# Patient Record
Sex: Female | Born: 1955 | Race: White | Hispanic: No | Smoking: Never smoker
Health system: Southern US, Community
[De-identification: ages and names within clinical notes are randomized; demographics above are authoritative.]

## PROBLEM LIST (undated history)

## (undated) DIAGNOSIS — J45909 Unspecified asthma, uncomplicated: Secondary | ICD-10-CM

## (undated) DIAGNOSIS — N809 Endometriosis, unspecified: Secondary | ICD-10-CM

## (undated) DIAGNOSIS — H3320 Serous retinal detachment, unspecified eye: Secondary | ICD-10-CM

## (undated) HISTORY — PX: OOPHORECTOMY: SHX86

## (undated) HISTORY — PX: APPENDECTOMY: SHX54

## (undated) HISTORY — DX: Serous retinal detachment, unspecified eye: H33.20

## (undated) HISTORY — PX: BREAST CYST ASPIRATION: SHX578

## (undated) HISTORY — DX: Endometriosis, unspecified: N80.9

## (undated) HISTORY — PX: BREAST BIOPSY: SHX20

---

## 1984-09-01 DIAGNOSIS — H3320 Serous retinal detachment, unspecified eye: Secondary | ICD-10-CM

## 1984-09-01 HISTORY — DX: Serous retinal detachment, unspecified eye: H33.20

## 1988-09-01 DIAGNOSIS — N809 Endometriosis, unspecified: Secondary | ICD-10-CM

## 1988-09-01 HISTORY — DX: Endometriosis, unspecified: N80.9

## 1998-09-01 HISTORY — PX: ABDOMINAL HYSTERECTOMY: SHX81

## 2007-07-31 ENCOUNTER — Ambulatory Visit: Payer: Self-pay | Admitting: Emergency Medicine

## 2008-03-30 ENCOUNTER — Ambulatory Visit: Payer: Self-pay | Admitting: Nurse Practitioner

## 2008-04-06 ENCOUNTER — Ambulatory Visit: Payer: Self-pay | Admitting: Gastroenterology

## 2009-04-16 ENCOUNTER — Ambulatory Visit: Payer: Self-pay | Admitting: Obstetrics and Gynecology

## 2010-04-18 ENCOUNTER — Ambulatory Visit: Payer: Self-pay | Admitting: Obstetrics and Gynecology

## 2010-04-30 ENCOUNTER — Ambulatory Visit: Payer: Self-pay | Admitting: Obstetrics and Gynecology

## 2011-04-30 ENCOUNTER — Ambulatory Visit: Payer: Self-pay | Admitting: Obstetrics and Gynecology

## 2011-05-07 ENCOUNTER — Ambulatory Visit: Payer: Self-pay | Admitting: Obstetrics and Gynecology

## 2012-05-12 ENCOUNTER — Ambulatory Visit: Payer: Self-pay | Admitting: Obstetrics and Gynecology

## 2013-06-01 ENCOUNTER — Ambulatory Visit: Payer: Self-pay | Admitting: Obstetrics and Gynecology

## 2014-06-06 ENCOUNTER — Ambulatory Visit: Payer: Self-pay | Admitting: Obstetrics and Gynecology

## 2015-05-30 ENCOUNTER — Other Ambulatory Visit: Payer: Self-pay | Admitting: Obstetrics and Gynecology

## 2015-05-30 DIAGNOSIS — Z1231 Encounter for screening mammogram for malignant neoplasm of breast: Secondary | ICD-10-CM

## 2015-06-12 ENCOUNTER — Ambulatory Visit
Admission: RE | Admit: 2015-06-12 | Discharge: 2015-06-12 | Disposition: A | Discharge status: Home / Self Care | Payer: BC Managed Care – PPO | Source: Ambulatory Visit | Attending: Obstetrics and Gynecology | Admitting: Obstetrics and Gynecology

## 2015-06-12 DIAGNOSIS — Z1231 Encounter for screening mammogram for malignant neoplasm of breast: Secondary | ICD-10-CM | POA: Diagnosis present

## 2015-06-18 ENCOUNTER — Other Ambulatory Visit: Payer: Self-pay | Admitting: Obstetrics and Gynecology

## 2015-06-18 DIAGNOSIS — R928 Other abnormal and inconclusive findings on diagnostic imaging of breast: Secondary | ICD-10-CM

## 2015-06-29 ENCOUNTER — Ambulatory Visit
Admission: RE | Admit: 2015-06-29 | Discharge: 2015-06-29 | Disposition: A | Discharge status: Home / Self Care | Payer: BC Managed Care – PPO | Source: Ambulatory Visit | Attending: Obstetrics and Gynecology | Admitting: Obstetrics and Gynecology

## 2015-06-29 DIAGNOSIS — R928 Other abnormal and inconclusive findings on diagnostic imaging of breast: Secondary | ICD-10-CM | POA: Diagnosis present

## 2015-06-29 DIAGNOSIS — N63 Unspecified lump in breast: Secondary | ICD-10-CM | POA: Diagnosis not present

## 2015-12-09 ENCOUNTER — Encounter: Payer: Self-pay | Admitting: *Deleted

## 2015-12-09 ENCOUNTER — Observation Stay: Payer: BC Managed Care – PPO | Admitting: Anesthesiology

## 2015-12-09 ENCOUNTER — Emergency Department: Payer: BC Managed Care – PPO

## 2015-12-09 ENCOUNTER — Observation Stay
Admission: EM | Admit: 2015-12-09 | Discharge: 2015-12-10 | Disposition: A | Discharge status: Home / Self Care | Payer: BC Managed Care – PPO | Attending: Surgery | Admitting: Surgery

## 2015-12-09 ENCOUNTER — Encounter
Admission: EM | Disposition: A | Discharge status: Home / Self Care | Payer: Self-pay | Source: Home / Self Care | Attending: Emergency Medicine

## 2015-12-09 DIAGNOSIS — J45909 Unspecified asthma, uncomplicated: Secondary | ICD-10-CM | POA: Insufficient documentation

## 2015-12-09 DIAGNOSIS — K353 Acute appendicitis with localized peritonitis, without perforation or gangrene: Secondary | ICD-10-CM | POA: Diagnosis present

## 2015-12-09 DIAGNOSIS — E876 Hypokalemia: Secondary | ICD-10-CM

## 2015-12-09 DIAGNOSIS — K3580 Unspecified acute appendicitis: Secondary | ICD-10-CM

## 2015-12-09 DIAGNOSIS — Z79899 Other long term (current) drug therapy: Secondary | ICD-10-CM | POA: Insufficient documentation

## 2015-12-09 DIAGNOSIS — Z90710 Acquired absence of both cervix and uterus: Secondary | ICD-10-CM | POA: Insufficient documentation

## 2015-12-09 DIAGNOSIS — Z7951 Long term (current) use of inhaled steroids: Secondary | ICD-10-CM | POA: Diagnosis not present

## 2015-12-09 DIAGNOSIS — Z9889 Other specified postprocedural states: Secondary | ICD-10-CM | POA: Insufficient documentation

## 2015-12-09 DIAGNOSIS — M479 Spondylosis, unspecified: Secondary | ICD-10-CM | POA: Insufficient documentation

## 2015-12-09 DIAGNOSIS — M5136 Other intervertebral disc degeneration, lumbar region: Secondary | ICD-10-CM | POA: Insufficient documentation

## 2015-12-09 DIAGNOSIS — K8020 Calculus of gallbladder without cholecystitis without obstruction: Secondary | ICD-10-CM | POA: Diagnosis not present

## 2015-12-09 HISTORY — DX: Unspecified asthma, uncomplicated: J45.909

## 2015-12-09 HISTORY — PX: LAPAROSCOPIC APPENDECTOMY: SHX408

## 2015-12-09 LAB — CBC
HEMATOCRIT: 41.3 % (ref 35.0–47.0)
HEMOGLOBIN: 13.9 g/dL (ref 12.0–16.0)
MCH: 25.8 pg — ABNORMAL LOW (ref 26.0–34.0)
MCHC: 33.6 g/dL (ref 32.0–36.0)
MCV: 76.9 fL — AB (ref 80.0–100.0)
Platelets: 196 10*3/uL (ref 150–440)
RBC: 5.37 MIL/uL — AB (ref 3.80–5.20)
RDW: 13.5 % (ref 11.5–14.5)
WBC: 15.6 10*3/uL — AB (ref 3.6–11.0)

## 2015-12-09 LAB — COMPREHENSIVE METABOLIC PANEL
ALT: 18 U/L (ref 14–54)
ANION GAP: 8 (ref 5–15)
AST: 26 U/L (ref 15–41)
Albumin: 3.8 g/dL (ref 3.5–5.0)
Alkaline Phosphatase: 86 U/L (ref 38–126)
BUN: 10 mg/dL (ref 6–20)
CO2: 22 mmol/L (ref 22–32)
Calcium: 9.1 mg/dL (ref 8.9–10.3)
Chloride: 106 mmol/L (ref 101–111)
Creatinine, Ser: 0.61 mg/dL (ref 0.44–1.00)
GFR calc Af Amer: >60 mL/min (ref >60)
Glucose, Bld: 100 mg/dL — ABNORMAL HIGH (ref 65–99)
POTASSIUM: 3.1 mmol/L — AB (ref 3.5–5.1)
Sodium: 136 mmol/L (ref 135–145)
Total Bilirubin: 0.7 mg/dL (ref 0.3–1.2)
Total Protein: 7.3 g/dL (ref 6.5–8.1)

## 2015-12-09 LAB — URINALYSIS COMPLETE WITH MICROSCOPIC (ARMC ONLY)
BACTERIA UA: NONE SEEN
Bilirubin Urine: NEGATIVE
Glucose, UA: NEGATIVE mg/dL
HGB URINE DIPSTICK: NEGATIVE
LEUKOCYTES UA: NEGATIVE
NITRITE: NEGATIVE
PH: 9 — AB (ref 5.0–8.0)
PROTEIN: NEGATIVE mg/dL
SPECIFIC GRAVITY, URINE: 1.011 (ref 1.005–1.030)
SQUAMOUS EPITHELIAL / LPF: NONE SEEN

## 2015-12-09 LAB — LIPASE, BLOOD: LIPASE: 16 U/L (ref 11–51)

## 2015-12-09 SURGERY — APPENDECTOMY, LAPAROSCOPIC
Anesthesia: General | Site: Abdomen | Wound class: Clean Contaminated

## 2015-12-09 MED ORDER — METRONIDAZOLE IN NACL 5-0.79 MG/ML-% IV SOLN
500.0000 mg | Freq: Once | INTRAVENOUS | Status: DC
  Filled 2015-12-09: qty 100

## 2015-12-09 MED ORDER — VENLAFAXINE HCL ER 37.5 MG PO CP24
37.5000 mg | ORAL_CAPSULE | Freq: Every day | ORAL | Status: DC
  Filled 2015-12-09: qty 1

## 2015-12-09 MED ORDER — SODIUM CHLORIDE 0.9 % IV BOLUS (SEPSIS)
1000.0000 mL | Freq: Once | INTRAVENOUS | Status: AC
  Administered 2015-12-09: 1000 mL via INTRAVENOUS

## 2015-12-09 MED ORDER — PHENYLEPHRINE HCL 10 MG/ML IJ SOLN
INTRAMUSCULAR | Status: DC | PRN
  Administered 2015-12-09 (×2): 50 ug via INTRAVENOUS

## 2015-12-09 MED ORDER — BUPIVACAINE-EPINEPHRINE 0.25% -1:200000 IJ SOLN
INTRAMUSCULAR | Status: DC | PRN
  Administered 2015-12-09: 30 mL

## 2015-12-09 MED ORDER — ONDANSETRON HCL 4 MG/2ML IJ SOLN: 4.0000 mg | Freq: Once | INTRAMUSCULAR | Status: DC | PRN

## 2015-12-09 MED ORDER — ONDANSETRON HCL 4 MG/2ML IJ SOLN
4.0000 mg | Freq: Once | INTRAMUSCULAR | Status: AC
  Administered 2015-12-09: 4 mg via INTRAVENOUS
  Filled 2015-12-09: qty 2

## 2015-12-09 MED ORDER — MORPHINE SULFATE (PF) 2 MG/ML IV SOLN
2.0000 mg | INTRAVENOUS | Status: DC | PRN
Start: 2015-12-09 — End: 2015-12-10

## 2015-12-09 MED ORDER — KETOROLAC TROMETHAMINE 30 MG/ML IJ SOLN
30.0000 mg | Freq: Four times a day (QID) | INTRAMUSCULAR | Status: DC
  Administered 2015-12-09 – 2015-12-10 (×4): 30 mg via INTRAVENOUS
  Filled 2015-12-09 (×4): qty 1

## 2015-12-09 MED ORDER — SUCCINYLCHOLINE CHLORIDE 20 MG/ML IJ SOLN
INTRAMUSCULAR | Status: DC | PRN
  Administered 2015-12-09 (×2): 20 mg via INTRAVENOUS
  Administered 2015-12-09: 100 mg via INTRAVENOUS
  Administered 2015-12-09: 30 mg via INTRAVENOUS

## 2015-12-09 MED ORDER — CEFAZOLIN SODIUM 1-5 GM-% IV SOLN
INTRAVENOUS | Status: DC | PRN
  Administered 2015-12-09: 1 g via INTRAVENOUS

## 2015-12-09 MED ORDER — KETOROLAC TROMETHAMINE 30 MG/ML IJ SOLN
INTRAMUSCULAR | Status: DC | PRN
  Administered 2015-12-09: 30 mg via INTRAVENOUS

## 2015-12-09 MED ORDER — METRONIDAZOLE IN NACL 5-0.79 MG/ML-% IV SOLN
INTRAVENOUS | Status: DC | PRN
  Administered 2015-12-09: 500 mg via INTRAVENOUS

## 2015-12-09 MED ORDER — METRONIDAZOLE IN NACL 5-0.79 MG/ML-% IV SOLN
500.0000 mg | Freq: Once | INTRAVENOUS | Status: AC
Start: 2015-12-09 — End: 2015-12-09
  Administered 2015-12-09: 500 mg via INTRAVENOUS
  Filled 2015-12-09: qty 100

## 2015-12-09 MED ORDER — PROPOFOL 10 MG/ML IV BOLUS
INTRAVENOUS | Status: DC | PRN
  Administered 2015-12-09: 150 mg via INTRAVENOUS

## 2015-12-09 MED ORDER — DIATRIZOATE MEGLUMINE & SODIUM 66-10 % PO SOLN
15.0000 mL | Freq: Once | ORAL | Status: AC
  Administered 2015-12-09: 15 mL via ORAL
  Filled 2015-12-09: qty 30

## 2015-12-09 MED ORDER — CIPROFLOXACIN IN D5W 400 MG/200ML IV SOLN
400.0000 mg | Freq: Two times a day (BID) | INTRAVENOUS | Status: DC
  Administered 2015-12-10: 400 mg via INTRAVENOUS
  Filled 2015-12-09 (×2): qty 200

## 2015-12-09 MED ORDER — VENLAFAXINE HCL 37.5 MG PO TABS
150.0000 mg | ORAL_TABLET | Freq: Every day | ORAL | Status: DC
  Administered 2015-12-10: 150 mg via ORAL
  Filled 2015-12-09: qty 4

## 2015-12-09 MED ORDER — FENTANYL CITRATE (PF) 100 MCG/2ML IJ SOLN
INTRAMUSCULAR | Status: DC | PRN
  Administered 2015-12-09: 100 ug via INTRAVENOUS
  Administered 2015-12-09 (×2): 50 ug via INTRAVENOUS
  Administered 2015-12-09: 100 ug via INTRAVENOUS

## 2015-12-09 MED ORDER — METRONIDAZOLE IN NACL 5-0.79 MG/ML-% IV SOLN
500.0000 mg | Freq: Three times a day (TID) | INTRAVENOUS | Status: AC
  Administered 2015-12-10 (×2): 500 mg via INTRAVENOUS
  Filled 2015-12-09 (×2): qty 100

## 2015-12-09 MED ORDER — ONDANSETRON 8 MG PO TBDP: 4.0000 mg | ORAL_TABLET | Freq: Four times a day (QID) | ORAL | Status: DC | PRN

## 2015-12-09 MED ORDER — LACTATED RINGERS IV SOLN
INTRAVENOUS | Status: DC | PRN
  Administered 2015-12-09: 16:00:00 via INTRAVENOUS

## 2015-12-09 MED ORDER — CIPROFLOXACIN IN D5W 400 MG/200ML IV SOLN
400.0000 mg | Freq: Once | INTRAVENOUS | Status: DC
  Filled 2015-12-09: qty 200

## 2015-12-09 MED ORDER — ONDANSETRON HCL 4 MG/2ML IJ SOLN: 4.0000 mg | Freq: Four times a day (QID) | INTRAMUSCULAR | Status: DC | PRN

## 2015-12-09 MED ORDER — CIPROFLOXACIN IN D5W 400 MG/200ML IV SOLN
400.0000 mg | Freq: Once | INTRAVENOUS | Status: AC
Start: 2015-12-09 — End: 2015-12-09
  Administered 2015-12-09: 400 mg via INTRAVENOUS
  Filled 2015-12-09: qty 200

## 2015-12-09 MED ORDER — MIDAZOLAM HCL 2 MG/2ML IJ SOLN
INTRAMUSCULAR | Status: DC | PRN
  Administered 2015-12-09 (×2): 2 mg via INTRAVENOUS

## 2015-12-09 MED ORDER — POTASSIUM CHLORIDE 10 MEQ/100ML IV SOLN
10.0000 meq | INTRAVENOUS | Status: AC
  Administered 2015-12-09 (×2): 10 meq via INTRAVENOUS
  Filled 2015-12-09 (×2): qty 100

## 2015-12-09 MED ORDER — IOPAMIDOL (ISOVUE-370) INJECTION 76%
100.0000 mL | Freq: Once | INTRAVENOUS | Status: AC | PRN
  Administered 2015-12-09: 100 mL via INTRAVENOUS

## 2015-12-09 MED ORDER — OXYCODONE HCL 5 MG PO TABS: 5.0000 mg | ORAL_TABLET | ORAL | Status: DC | PRN

## 2015-12-09 MED ORDER — ONDANSETRON HCL 4 MG/2ML IJ SOLN
INTRAMUSCULAR | Status: DC | PRN
  Administered 2015-12-09: 4 mg via INTRAVENOUS

## 2015-12-09 MED ORDER — FENTANYL CITRATE (PF) 100 MCG/2ML IJ SOLN: 25.0000 ug | INTRAMUSCULAR | Status: DC | PRN

## 2015-12-09 MED ORDER — PANTOPRAZOLE SODIUM 40 MG IV SOLR
40.0000 mg | Freq: Every day | INTRAVENOUS | Status: DC
  Administered 2015-12-09: 40 mg via INTRAVENOUS
  Filled 2015-12-09: qty 40

## 2015-12-09 MED ORDER — CIPROFLOXACIN IN D5W 400 MG/200ML IV SOLN
INTRAVENOUS | Status: DC | PRN
Start: 2015-12-09 — End: 2015-12-09
  Administered 2015-12-09: 400 mg via INTRAVENOUS

## 2015-12-09 MED ORDER — HYDROMORPHONE HCL 1 MG/ML IJ SOLN
0.5000 mg | Freq: Once | INTRAMUSCULAR | Status: DC
  Filled 2015-12-09: qty 1

## 2015-12-09 MED ORDER — LACTATED RINGERS IV SOLN
INTRAVENOUS | Status: DC
  Administered 2015-12-09 – 2015-12-10 (×2): via INTRAVENOUS

## 2015-12-09 MED ORDER — ENOXAPARIN SODIUM 40 MG/0.4ML ~~LOC~~ SOLN
40.0000 mg | SUBCUTANEOUS | Status: DC
  Administered 2015-12-09: 40 mg via SUBCUTANEOUS
  Filled 2015-12-09: qty 0.4

## 2015-12-09 SURGICAL SUPPLY — 35 items
APPLIER CLIP 5 13 M/L LIGAMAX5 (MISCELLANEOUS) ×3
BLADE CLIPPER SURG (BLADE) ×3 IMPLANT
BLADE SURG SZ11 CARB STEEL (BLADE) ×3 IMPLANT
BULB RESERV EVAC DRAIN JP 100C (MISCELLANEOUS) ×3 IMPLANT
CANISTER SUCT 3000ML (MISCELLANEOUS) ×3 IMPLANT
CHLORAPREP W/TINT 26ML (MISCELLANEOUS) ×3 IMPLANT
CLIP APPLIE 5 13 M/L LIGAMAX5 (MISCELLANEOUS) ×1 IMPLANT
CUTTER FLEX LINEAR 45M (STAPLE) ×3 IMPLANT
DRAIN CHANNEL JP 19F (MISCELLANEOUS) ×3 IMPLANT
ELECT REM PT RETURN 9FT ADLT (ELECTROSURGICAL) ×3
ELECTRODE REM PT RTRN 9FT ADLT (ELECTROSURGICAL) ×1 IMPLANT
ENDOPOUCH RETRIEVER 10 (MISCELLANEOUS) ×3 IMPLANT
GLOVE BIO SURGEON STRL SZ7 (GLOVE) ×3 IMPLANT
GOWN STRL REUS W/ TWL LRG LVL3 (GOWN DISPOSABLE) ×1 IMPLANT
GOWN STRL REUS W/TWL LRG LVL3 (GOWN DISPOSABLE) ×2
IRRIGATION STRYKERFLOW (MISCELLANEOUS) ×1 IMPLANT
IRRIGATOR STRYKERFLOW (MISCELLANEOUS) ×3
LIQUID BAND (GAUZE/BANDAGES/DRESSINGS) ×3 IMPLANT
MARKER SKIN DUAL TIP RULER LAB (MISCELLANEOUS) ×3 IMPLANT
NEEDLE HYPO 25X1 1.5 SAFETY (NEEDLE) ×3 IMPLANT
PACK LAP CHOLECYSTECTOMY (MISCELLANEOUS) ×3 IMPLANT
PENCIL ELECTRO HAND CTR (MISCELLANEOUS) ×3 IMPLANT
RELOAD 45 VASCULAR/THIN (ENDOMECHANICALS) ×3 IMPLANT
RELOAD STAPLE TA45 3.5 REG BLU (ENDOMECHANICALS) IMPLANT
SCALPEL HARMONIC ACE (MISCELLANEOUS) ×3 IMPLANT
SCISSORS METZENBAUM CVD 33 (INSTRUMENTS) ×3 IMPLANT
SUT MNCRL AB 4-0 PS2 18 (SUTURE) ×3 IMPLANT
SUT VICRYL 0 AB UR-6 (SUTURE) ×6 IMPLANT
SYR 20CC LL (SYRINGE) ×3 IMPLANT
TRAY FOLEY W/METER SILVER 16FR (SET/KITS/TRAYS/PACK) IMPLANT
TROCAR BALLN 12MMX100 BLUNT (TROCAR) ×3 IMPLANT
TROCAR Z-THREAD OPTICAL 5X100M (TROCAR) ×6 IMPLANT
TUBING CONNECTING 10 (TUBING) ×2 IMPLANT
TUBING CONNECTING 10' (TUBING) ×1
WATER STERILE IRR 1000ML POUR (IV SOLUTION) IMPLANT

## 2015-12-09 NOTE — ED Notes (Signed)
States RLQ abd pain that began at 0200 this AM radiating to her belly button, woke her up from sleep, states she has been sick with flu like symptoms since Thursday, nausea and vomiting on Friday and slept a lot, states some chest tightness when she coughs, pt awake and alert, husband at bedside

## 2015-12-09 NOTE — Anesthesia Procedure Notes (Signed)
Procedure Name: Intubation Date/Time: 12/09/2015 4:10 PM Performed by: Lorie Apley Pre-anesthesia Checklist: Patient identified, Emergency Drugs available, Suction available, Patient being monitored and Timeout performed Patient Re-evaluated:Patient Re-evaluated prior to inductionOxygen Delivery Method: Circle system utilized Preoxygenation: Pre-oxygenation with 100% oxygen Intubation Type: IV induction Ventilation: Mask ventilation without difficulty Laryngoscope Size: Mac and 3 Grade View: Grade I Tube type: Oral Tube size: 7.5 mm Number of attempts: 1 Placement Confirmation: ETT inserted through vocal cords under direct vision Secured at: 22 cm Tube secured with: Tape

## 2015-12-09 NOTE — Progress Notes (Signed)
Contacted on call provider, Dr. Adonis Huguenin. Pt was ordered 37.5 mg of effexor. Pt takes 150mg  at home. Order modified.

## 2015-12-09 NOTE — Anesthesia Preprocedure Evaluation (Addendum)
Anesthesia Evaluation  Patient identified by MRN, date of birth, ID band Patient awake    Reviewed: Allergy & Precautions, NPO status , Patient's Chart, lab work & pertinent test results  Airway Mallampati: II  TM Distance: >3 FB     Dental no notable dental hx.    Pulmonary asthma ,  Sinus allergies   Pulmonary exam normal        Cardiovascular negative cardio ROS Normal cardiovascular exam     Neuro/Psych negative neurological ROS  negative psych ROS   GI/Hepatic Neg liver ROS, Acute appendix   Endo/Other  negative endocrine ROS  Renal/GU negative Renal ROS  negative genitourinary   Musculoskeletal negative musculoskeletal ROS (+)   Abdominal Normal abdominal exam  (+)   Peds negative pediatric ROS (+)  Hematology negative hematology ROS (+)   Anesthesia Other Findings   Reproductive/Obstetrics negative OB ROS                            Anesthesia Physical Anesthesia Plan  ASA: II and emergent  Anesthesia Plan: General   Post-op Pain Management:    Induction: Intravenous, Rapid sequence and Cricoid pressure planned  Airway Management Planned: Oral ETT  Additional Equipment:   Intra-op Plan:   Post-operative Plan: Extubation in OR  Informed Consent: I have reviewed the patients History and Physical, chart, labs and discussed the procedure including the risks, benefits and alternatives for the proposed anesthesia with the patient or authorized representative who has indicated his/her understanding and acceptance.   Dental advisory given  Plan Discussed with: CRNA and Surgeon  Anesthesia Plan Comments:         Anesthesia Quick Evaluation

## 2015-12-09 NOTE — ED Notes (Signed)
MD at bedside. 

## 2015-12-09 NOTE — ED Notes (Signed)
Pt transported to OR by OR RN. All belongings including clothes and fitbit placed in belongings bag and given to pt's spouse.

## 2015-12-09 NOTE — ED Provider Notes (Signed)
[facility] Emergency Department Provider Note  ____________________________________________  Time seen: Approximately 9:05 AM  I have reviewed the triage vital signs and the nursing notes.   HISTORY  Chief Complaint Abdominal Pain    HPI Chelsea Conner is a 60 y.o. female S/P hysterectomy and left oophorectomy presenting with right lower quadrant pain.Patient reports that 3 nights ago she fell "acidy" prior to going to bed, then woke up in the middle of night with a single episode of vomiting. The following day she had fever to 102. She had general malaise and some loose stools that day. Yesterday, she was feeling much better and was able to go about her normal ADLs without any difficulties or symptoms. This morning, she developed nausea without vomiting, had a loose stool, and right lower quadrant pain. She describes a "gas-like" sensation that feels like it moves around and is better or worse at different times, but is not particularly alleviated after BM. No urinary symptoms, fever in the last 24 hours, or shaking chills.   Past Medical History  Diagnosis Date  . Asthma     There are no active problems to display for this patient.   Past Surgical History  Procedure Laterality Date  . Abdominal hysterectomy    . Breast cyst aspiration Left     neg  . Breast biopsy Bilateral 98 &2000    RT /CLIP, LT- NO CLIP- BOTH NEG    Current Outpatient Rx  Name  Route  Sig  Dispense  Refill  . acetaminophen (TYLENOL) 500 MG tablet   Oral   Take 1,000 mg by mouth every 6 (six) hours as needed.         . calcium carbonate (CALCIUM 600) 600 MG TABS tablet   Oral   Take 1 tablet by mouth daily.         . Loratadine 10 MG CAPS   Oral   Take 10 mg by mouth daily.         . pseudoephedrine (SUDAFED) 120 MG 12 hr tablet   Oral   Take 120 mg by mouth daily as needed.         . triamcinolone (NASACORT) 55 MCG/ACT AERO nasal inhaler   Nasal  Place 2 sprays into the nose daily.         Marland Kitchen venlafaxine XR (EFFEXOR-XR) 37.5 MG 24 hr capsule   Oral   Take 4 capsules by mouth daily.      11     Allergies Neomycin; Penicillins; and Codeine  History reviewed. No pertinent family history.  Social History Social History  Substance Use Topics  . Smoking status: None  . Smokeless tobacco: None  . Alcohol Use: None    Review of Systems Constitutional: Positive fever 3 days ago. No lightheadedness or syncope. Eyes: No visual changes. ENT: No sore throat. No congestion or rhinorrhea. Cardiovascular: Denies chest pain. Denies palpitations. Respiratory: Denies shortness of breath.  No cough. Gastrointestinal: Positive right lower quadrant abdominal pain.  Positive nausea, positive vomiting.  Positive diarrhea.  No constipation. Genitourinary: Negative for dysuria. Musculoskeletal: Negative for back pain. Skin: Negative for rash. Neurological: Negative for headaches. No focal numbness, tingling or weakness.   10-point ROS otherwise negative.  ____________________________________________   PHYSICAL EXAM:  VITAL SIGNS: ED Triage Vitals  Enc Vitals Group     BP 12/09/15 0816 143/61 mmHg     Pulse Rate 12/09/15 0816 78     Resp 12/09/15 0816 18  Temp 12/09/15 0816 98.9 F (37.2 C)     Temp Source 12/09/15 0816 Oral     SpO2 12/09/15 0816 100 %     Weight 12/09/15 0816 163 lb (73.936 kg)     Height 12/09/15 0816 5\' 2"  (1.575 m)     Head Cir --      Peak Flow --      Pain Score 12/09/15 0817 1     Pain Loc --      Pain Edu? --      Excl. in [place]? --     Constitutional: Alert and oriented. Well appearing and in no acute distress. Answers questions appropriately. Eyes: Conjunctivae are normal.  EOMI. No scleral icterus. Head: Atraumatic. Nose: No congestion/rhinnorhea. Mouth/Throat: Mucous membranes are moist.  Neck: No stridor.  Supple.   Cardiovascular: Normal rate, regular rhythm. No murmurs, rubs or  gallops.  Respiratory: Normal respiratory effort.  No accessory muscle use or retractions. Lungs CTAB.  No wheezes, rales or ronchi. Gastrointestinal: Abdomen is soft and nondistended. The patient has some right lower greater than right upper quadrant abdominal pain.  No guarding or rebound.  No peritoneal signs. Musculoskeletal: No LE edema. No ttp in the calves or palpable cords.  Negative Homan's sign. Neurologic:  A&Ox3.  Speech is clear.  Face and smile are symmetric.  EOMI.  Moves all extremities well. Skin:  Skin is warm, dry and intact. No rash noted. Psychiatric: Mood and affect are normal. Speech and behavior are normal.  Normal judgement.  ____________________________________________   LABS (all labs ordered are listed, but only abnormal results are displayed)  Labs Reviewed  COMPREHENSIVE METABOLIC PANEL - Abnormal; Notable for the following:    Potassium 3.1 (*)    Glucose, Bld 100 (*)    All other components within normal limits  CBC - Abnormal; Notable for the following:    WBC 15.6 (*)    RBC 5.37 (*)    MCV 76.9 (*)    MCH 25.8 (*)    All other components within normal limits  URINALYSIS COMPLETEWITH MICROSCOPIC ([facility] ONLY) - Abnormal; Notable for the following:    Color, Urine YELLOW (*)    APPearance CLOUDY (*)    Ketones, ur TRACE (*)    pH 9.0 (*)    All other components within normal limits  LIPASE, BLOOD   ____________________________________________  EKG  ED ECG REPORT I, Eula Listen, the attending physician, personally viewed and interpreted this ECG.   Date: 12/09/2015  EKG Time: 818  Rate: 82  Rhythm: normal sinus rhythm  Axis: Normal  Intervals:none  ST&T Change: Nonspecific T-wave inversion in V1.  ____________________________________________  RADIOLOGY  Ct Abdomen Pelvis W Contrast  12/09/2015  CLINICAL DATA:  Right lower quadrant abdominal pain EXAM: CT ABDOMEN AND PELVIS WITH CONTRAST TECHNIQUE: Multidetector CT imaging of  the abdomen and pelvis was performed using the standard protocol following bolus administration of intravenous contrast. CONTRAST:  100 cc of Isovue 370 COMPARISON:  None FINDINGS: Lower chest:  No acute findings. Hepatobiliary: No masses or other significant abnormality. Right lobe of liver cyst measures 1.4 cm. There are multiple large stones within the gallbladder which measure up to 1.9 cm. No biliary dilatation. Pancreas: No mass, inflammatory changes, or other significant abnormality. Spleen: Within normal limits in size and appearance. Adrenals/Urinary Tract: No masses identified. No evidence of hydronephrosis. Stomach/Bowel: The stomach is within normal limits. The small bowel loops have a normal course and caliber. No obstruction. Normal appearance of  the colon. The appendix is thickened measuring 11 mm. There is periappendiceal inflammation. A small amount of fluid extends into the pelvis. No evidence for abscess. Vascular/Lymphatic: No pathologically enlarged lymph nodes. No evidence of abdominal aortic aneurysm. Reproductive: No mass or other significant abnormality. Other: None. Musculoskeletal: No suspicious bone lesions identified. 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 There is an anterolisthesis of L4 on L5. IMPRESSION: 1. Examination is positive for acute appendicitis. 2. No abscess. 3. Lumbar spondylosis noted. 4. Gallstones. Electronically Signed   By: Kerby Moors M.D.   On: 12/09/2015 10:32    ____________________________________________   PROCEDURES  Procedure(s) performed: None  Critical Care performed: No ____________________________________________   INITIAL IMPRESSION / ASSESSMENT AND PLAN / ED COURSE  Pertinent labs & imaging results that were available during my care of the patient were reviewed by me and considered in my medical decision making (see chart for details).  60 y.o. female presenting w/ vomiting x 1, fever now resolved, loose stool  and now RLQ pain.  I will evaluate the pt for appendicitis, consider viral GI illness.  Ovarian pathology is possible but less likely as her palpable pain is in the upper RUQ rather than low in the pelvis.  Plan to initiate symptomatic treatment and re-eval the patient for final disposition.  ----------------------------------------- 10:54 AM on 12/09/2015 -----------------------------------------  The patient has acute appendicitis and I will initiate IV antibiotics with Cipro and Flagyl as she has a penicillin allergy. I have talked to the general surgeon who is in a case in the operating room but will see the patient in the next 20-30 minutes.  ____________________________________________  FINAL CLINICAL IMPRESSION(S) / ED DIAGNOSES  Final diagnoses:  Acute appendicitis, unspecified acute appendicitis type  Hypokalemia      NEW MEDICATIONS STARTED DURING THIS VISIT:  New Prescriptions   No medications on file     Eula Listen, MD 12/09/15 1054

## 2015-12-09 NOTE — ED Notes (Addendum)
Pt states she has similar pain several years ago when she had her left ovary removed, right ovary still in place

## 2015-12-09 NOTE — H&P (Signed)
Patient ID: Chelsea Conner, female   DOB: 1956-08-04, 60 y.o.   MRN: [id number]  History of Present Illness Chelsea Conner is a 60 y.o. female with abd pain. She actually started with nausea and vomiting and fevers up to 102. This morning she started having right lower quadrant pain, pain is moderate, intermittent and not radiating. It is sharp in nature and worsening with movement. She does have associated nausea and hypoRexia She has had at abdominal hysterectomy in the past and no other abdominal operations. she does have good cardiovascular reserve Past Medical History Past Medical History  Diagnosis Date  . Asthma     Past Surgical History  Procedure Laterality Date  . Abdominal hysterectomy    . Breast cyst aspiration Left     neg  . Breast biopsy Bilateral 98 &2000    RT /CLIP, LT- NO CLIP- BOTH NEG   ALL: per chart   Current Facility-Administered Medications  Medication Dose Route Frequency Provider Last Rate Last Dose  . HYDROmorphone (DILAUDID) injection 0.5 mg  0.5 mg Intravenous Once Eula Listen, MD   0 mg at 12/09/15 0916  . potassium chloride 10 mEq in 100 mL IVPB  10 mEq Intravenous Q1 Hr x 2 Anne-Caroline Norman, MD 100 mL/hr at 12/09/15 1333 10 mEq at 12/09/15 1333   Current Outpatient Prescriptions  Medication Sig Dispense Refill  . acetaminophen (TYLENOL) 500 MG tablet Take 1,000 mg by mouth every 6 (six) hours as needed.    . calcium carbonate (CALCIUM 600) 600 MG TABS tablet Take 1 tablet by mouth daily.    . Loratadine 10 MG CAPS Take 10 mg by mouth daily.    . pseudoephedrine (SUDAFED) 120 MG 12 hr tablet Take 120 mg by mouth daily as needed.    . triamcinolone (NASACORT) 55 MCG/ACT AERO nasal inhaler Place 2 sprays into the nose daily.    Marland Kitchen venlafaxine XR (EFFEXOR-XR) 37.5 MG 24 hr capsule Take 4 capsules by mouth daily.  11    Family History History reviewed. No pertinent family history.    Social History Social History   Substance Use Topics  . Smoking status: None  . Smokeless tobacco: None  . Alcohol Use: None       ROS 10 ptl review of system was performed and is otherwise negative  Physical Exam Blood pressure 107/52, pulse 76, temperature 98.9 F (37.2 C), temperature source Oral, resp. rate 15, height 5\' 2"  (1.575 m), weight 73.936 kg (163 lb), SpO2 100 %.  CONSTITUTIONAL: NAD alert EYES: Pupils equal, round, and reactive to light, Sclera non-icteric. EARS, NOSE, MOUTH AND THROAT: The oropharynx is clear. Oral mucosa is pink and moist. Hearing is intact to voice.  NECK: Trachea is midline, and there is no jugular venous distension. Thyroid is without palpable abnormalities. LYMPH NODES:  Lymph nodes in the neck are not enlarged. RESPIRATORY:  Lungs are clear, and breath sounds are equal bilaterally. Normal respiratory effort without pathologic use of accessory muscles. CARDIOVASCULAR: Heart is regular without murmurs, gallops, or rubs. GI: The abdomen is soft, tender RLQ, no peritonitis and nondistended. There were no palpable masses. There was no hepatosplenomegaly. There were normal bowel sounds. MUSCULOSKELETAL:  Normal muscle strength and tone in all four extremities.    SKIN: Skin turgor is normal. There are no pathologic skin lesions.  NEUROLOGIC:  Motor and sensation is grossly normal.  Cranial nerves are grossly intact. PSYCH:  Alert and oriented to person, place and time. Affect is normal.  Data Reviewed The scan review consistent with appendicitis no evidence of abscess or perforation  I have personally reviewed the patient's imaging and medical records.    Assessment/ Plan  Acute appendicitis without evidence of perforation in need for laparoscopic appendectomy. She is been febrile with increasing white count . Procedure discussed with the patient in detail. Risks benefits and possible complications including but not limited to: Bleeding, infection, injury to adjacent structures  and re-interventions she understands and wishes to proceed. Extensive counseling provided we'll go ahead and do it is evening.  Laparoscopic possible open  Caroleen Hamman, MD [place] 12/09/2015, 2:12 PM

## 2015-12-09 NOTE — ED Notes (Signed)
Pt up to provide urine sample

## 2015-12-09 NOTE — ED Notes (Signed)
OR staff at bedside.

## 2015-12-09 NOTE — ED Notes (Signed)
Pt finished with oral contrast, CT informed.

## 2015-12-09 NOTE — ED Notes (Signed)
Pt up to bathroom. In to administer IV antibiotics, unable to do so at this time.

## 2015-12-09 NOTE — Transfer of Care (Signed)
Immediate Anesthesia Transfer of Care Note  Patient: Chelsea Conner  Procedure(s) Performed: Procedure(s): APPENDECTOMY LAPAROSCOPIC (N/A)  Patient Location: PACU  Anesthesia Type:General  Level of Consciousness: awake, alert  and oriented  Airway & Oxygen Therapy: Patient Spontanous Breathing  Post-op Assessment: Report given to RN and Post -op Vital signs reviewed and stable  Post vital signs: Reviewed and stable  Last Vitals:  Filed Vitals:   12/09/15 1528 12/09/15 1734  BP:  117/70  Pulse:  101  Temp: 37.9 C   Resp:  15    Complications: No apparent anesthesia complications

## 2015-12-09 NOTE — Op Note (Signed)
laparascopic appendectomy   Chelsea Conner Date of operation:  12/09/2015  Indications: The patient presented with a history of  abdominal pain. Workup has revealed findings consistent with acute appendicitis.  Pre-operative Diagnosis: Acute appendicitis without mention of peritonitis  Post-operative Diagnosis: Acute appendicitis without mention of peritonitis  PROCEDURES: 1. Laparoscopic enterolysis 2. Lap  Appendectomy  Surgeon: Caroleen Hamman, MD, FACS  Anesthesia: General with endotracheal tube  Findings: Acute non perforated appendicitis, retrocecal.                  Thick adhesions from the abdominal wall to the cecum  Estimated Blood Loss: 10 cc         Specimens: appendix         Complications:  none  Procedure Details  The patient was seen again in the preop area. The options of surgery versus observation were reviewed with the patient and/or family. The risks of bleeding, infection, recurrence of symptoms, negative laparoscopy, potential for an open procedure, bowel injury, abscess or infection, were all reviewed as well. The patient was taken to Operating Room, identified as Chelsea Conner and the procedure verified as laparoscopic appendectomy. A Time Out was held and the above information confirmed.  The patient was placed in the supine position and general anesthesia was induced.  Antibiotic prophylaxis was administered and VT E prophylaxis was in place. A Foley catheter was placed by the nursing staff.   The abdomen was prepped and draped in a sterile fashion. An infraumbilical incision was made. A cutdown technique was used to enter the abdominal cavity. Two vicryl stitches were placed on the fascia and a Hasson trocar inserted. Pneumoperitoneum obtained. Two 5 mm ports were placed under direct visualization. Extensive and thick adhesions encountered from the cecum to the abdominal wall this were taken very careful with a combination of scissors and harmonic  scalpel. Once we have a good window of dissection, The appendix was identified and found to be acutely inflamed  The appendix was carefully dissected. The base of the appendix was dissected out and divided with a standard load Endo GIA. The mesoappendix was divided withHarmonic scalpel. The appendix was passed out through the left lateral port site with the aid of an Endo Catch bag. The right lower quadrant and pelvis was then irrigated with  normal saline which was aspirated. Inspection  failed to identify any additional bleeding and there were no signs of bowel injury.    Again the right lower quadrant was inspected there was no sign of bleeding or bowel injury therefore pneumoperitoneum was released, Umbilical fascia closed with interrupted 0 vicrils. all ports were removed and the skin incisions were approximated with subcuticular 4-0 Monocryl. Dermabond was  placed.  The patient tolerated the procedure well, there were no complications. The sponge lap and needle count were correct at the end of the procedure.  The patient was taken to the recovery room in stable condition to be admitted for continued care.    Caroleen Hamman, MD FACS

## 2015-12-09 NOTE — Anesthesia Postprocedure Evaluation (Signed)
Anesthesia Post Note  Patient: Chelsea Conner  Procedure(s) Performed: Procedure(s) (LRB): APPENDECTOMY LAPAROSCOPIC (N/A)  Patient location during evaluation: PACU Anesthesia Type: General Level of consciousness: awake and alert and oriented Pain management: pain level controlled Vital Signs Assessment: post-procedure vital signs reviewed and stable Respiratory status: spontaneous breathing Cardiovascular status: blood pressure returned to baseline Anesthetic complications: no    Last Vitals:  Filed Vitals:   12/09/15 1815 12/09/15 1841  BP: 113/64 116/65  Pulse: 82 74  Temp:  36.9 C  Resp: 21     Last Pain:  Filed Vitals:   12/09/15 1843  PainSc: 0-No pain                 Rishab Stoudt

## 2015-12-09 NOTE — ED Notes (Signed)
Report called to [place]

## 2015-12-10 ENCOUNTER — Encounter: Payer: Self-pay | Admitting: *Deleted

## 2015-12-10 MED ORDER — METRONIDAZOLE 500 MG PO TABS: 500.0000 mg | ORAL_TABLET | Freq: Three times a day (TID) | ORAL | Status: DC

## 2015-12-10 MED ORDER — OXYCODONE-ACETAMINOPHEN 5-325 MG PO TABS: 1.0000 | ORAL_TABLET | ORAL | Status: AC | PRN

## 2015-12-10 MED ORDER — CIPROFLOXACIN HCL 500 MG PO TABS: 500.0000 mg | ORAL_TABLET | Freq: Two times a day (BID) | ORAL | Status: DC

## 2015-12-10 NOTE — Discharge Summary (Signed)
Physician Discharge Summary  Patient ID: Chelsea Conner MRN: [id number] DOB/AGE: 1955-09-08 60 y.o.  Admit date: 12/09/2015 Discharge date: 12/10/2015  Admission Diagnoses:Acute appendicitis  Discharge Diagnoses:  Active Problems:   Acute appendicitis with localized peritonitis   Discharged Condition: good  Hospital Course: 60 yr old with acute appendicitis.  Underwent lap appy on 4/9 with Dr. Dahlia Byes, with some murky fluid in pelvis, JP drain left.  Patient doing well, states eating well, passing flatus.  She has been up and walking around. She has not needed any pain medications, states pain well controlled now.   Consults: None  Significant Diagnostic Studies: CT scan  Treatments: IV hydration and antibiotics: Cipro and Meropenem  Discharge Exam: Blood pressure 104/55, pulse 64, temperature 98.5 F (36.9 C), temperature source Oral, resp. rate 17, height 5\' 2"  (1.575 m), weight 163 lb (73.936 kg), SpO2 95 %. General appearance: alert, cooperative and no distress GI: soft, mild distension, appropriately tender, incisions c/d/i, jp drain in lower incision with serosanginous output Extremities: extremities normal, atraumatic, no cyanosis or edema  Disposition: Final discharge disposition not confirmed  Discharge Instructions    Call MD for:  persistant nausea and vomiting    Complete by:  As directed      Call MD for:  redness, tenderness, or signs of infection (pain, swelling, redness, odor or green/yellow discharge around incision site)    Complete by:  As directed      Call MD for:  severe uncontrolled pain    Complete by:  As directed      Call MD for:  temperature >100.4    Complete by:  As directed      Change dressing (specify)    Complete by:  As directed   Dressing change: 1 times per day using dry dressing around tube if needed, if no leaking do not need dressing.     Diet general    Complete by:  As directed      Discharge instructions    Complete by:  As  directed   Please empty, measure and recharge JP drain daily and as needed     Driving Restrictions    Complete by:  As directed   No driving while on prescription pain medication     Increase activity slowly    Complete by:  As directed      Lifting restrictions    Complete by:  As directed   No lifting over 15lb for 4weeks     May shower / Bathe    Complete by:  As directed      May walk up steps    Complete by:  As directed             Medication List    TAKE these medications        acetaminophen 500 MG tablet  Commonly known as:  TYLENOL  Take 1,000 mg by mouth every 6 (six) hours as needed.     CALCIUM 600 600 MG Tabs tablet  Generic drug:  calcium carbonate  Take 1 tablet by mouth daily.     ciprofloxacin 500 MG tablet  Commonly known as:  CIPRO  Take 1 tablet (500 mg total) by mouth 2 (two) times daily.     Loratadine 10 MG Caps  Take 10 mg by mouth daily.     metroNIDAZOLE 500 MG tablet  Commonly known as:  FLAGYL  Take 1 tablet (500 mg total) by mouth 3 (three) times daily.  oxyCODONE-acetaminophen 5-325 MG tablet  Commonly known as:  ROXICET  Take 1-2 tablets by mouth every 4 (four) hours as needed for moderate pain or severe pain.     pseudoephedrine 120 MG 12 hr tablet  Commonly known as:  SUDAFED  Take 120 mg by mouth daily as needed.     triamcinolone 55 MCG/ACT Aero nasal inhaler  Commonly known as:  NASACORT  Place 2 sprays into the nose daily.     venlafaxine XR 37.5 MG 24 hr capsule  Commonly known as:  EFFEXOR-XR  Take 4 capsules by mouth daily.           Follow-up Information    Follow up with Jules Husbands, MD.   Specialty:  General Surgery   Contact information:   [address] [place]  [address] [phone]       Signed: Hubbard Robinson 12/10/2015, 3:06 PM

## 2015-12-11 ENCOUNTER — Telehealth: Payer: Self-pay

## 2015-12-11 ENCOUNTER — Telehealth: Payer: Self-pay | Admitting: Surgery

## 2015-12-11 LAB — SURGICAL PATHOLOGY

## 2015-12-11 NOTE — Telephone Encounter (Signed)
Patient called in and states that she is having some leakage at the insertion site of the JP drain. I had an in depth conversation of how to empty the drain and make sure that it is recharged and that it is holding suction. I was having a great amount of difficulty understanding and explaining this to the patient. The patient placed me on speaker phone so that her husband could hear what I was saying as well. I attempted once again to explain how to empty drain, recharging, and trouble shooting of drain. After this conversation, I am understanding that the drain has been emptied for a total of 82ml over the last 18 hours. 10cc at 11pm last night, 0400- 20cc, 0800- 20cc, and 1130am- 10cc. So drain seems to be working correctly. The drainage sounds to be serosangionous at this time. The patient has not been keeping a dressing over the insertion site of the drain and this has been soaking her clothes. I explained that she would need to get some 4x4 guaze, cut half way through one side of guaze, and place around the drain like a C- shape. She seem to understand what I meant. I explained that she needs to change this dressing at least once a day and when it becomes soiled. The conversation was very difficult with this patient as I do not know if I was getting through to the patient but husband read back instructions. Patient denies severe pain, nausea, vomiting, fever, drainage of purulent fluid, or any other symptom at this time. Asked for patient to call back if after placing a guaze on this area, she is needing to change this guaze once an hour or more frequently. She verbalizes understanding of this part of the conversation.

## 2015-12-11 NOTE — Telephone Encounter (Signed)
error 

## 2015-12-13 ENCOUNTER — Encounter: Payer: Self-pay | Admitting: Surgery

## 2015-12-13 ENCOUNTER — Ambulatory Visit (INDEPENDENT_AMBULATORY_CARE_PROVIDER_SITE_OTHER): Payer: BC Managed Care – PPO | Admitting: Surgery

## 2015-12-13 ENCOUNTER — Other Ambulatory Visit: Payer: Self-pay

## 2015-12-13 VITALS — BP 143/81 | HR 84 | Temp 98.6°F | Ht 62.0 in | Wt 168.4 lb

## 2015-12-13 DIAGNOSIS — Z09 Encounter for follow-up examination after completed treatment for conditions other than malignant neoplasm: Secondary | ICD-10-CM

## 2015-12-13 DIAGNOSIS — K219 Gastro-esophageal reflux disease without esophagitis: Secondary | ICD-10-CM | POA: Insufficient documentation

## 2015-12-13 DIAGNOSIS — J45909 Unspecified asthma, uncomplicated: Secondary | ICD-10-CM | POA: Insufficient documentation

## 2015-12-13 DIAGNOSIS — J309 Allergic rhinitis, unspecified: Secondary | ICD-10-CM | POA: Insufficient documentation

## 2015-12-13 NOTE — Patient Instructions (Signed)
Please call our office with any questions or concerns.  Please do not submerge in a tub, hot tub, or pool until incisions are completely sealed.  Use sun block to incision area over the next year if this area will be exposed to sun. This helps decrease scarring.  You may now resume your normal activities. Listen to your body when lifting, if you have pain when lifting, stop and then try again in a few days.  If you develop redness, drainage, or pain at incision sites- call our office immediately and speak with a nurse.    You will need to keep a dressing applied to the incision area for the next 72 hours or until drainage has stopped completely.  We will see you back in the office in one week as scheduled below.

## 2015-12-13 NOTE — Progress Notes (Signed)
S/p lap appy last week, overall doing well. No fevers, taking PO, Having some loose BM JP serous less 100cc , she hurts where the JP is  PE NAD Abd: soft, incisions c/d/i, jp serous, removed  A/P doing well RTC next week to make sure she progresses well No complications

## 2015-12-19 ENCOUNTER — Other Ambulatory Visit: Payer: Self-pay

## 2015-12-19 ENCOUNTER — Other Ambulatory Visit
Admission: RE | Admit: 2015-12-19 | Discharge: 2015-12-19 | Disposition: A | Discharge status: Home / Self Care | Payer: BC Managed Care – PPO | Source: Ambulatory Visit | Attending: Surgery | Admitting: Surgery

## 2015-12-19 DIAGNOSIS — R319 Hematuria, unspecified: Secondary | ICD-10-CM | POA: Insufficient documentation

## 2015-12-19 LAB — URINALYSIS COMPLETE WITH MICROSCOPIC (ARMC ONLY)
BILIRUBIN URINE: NEGATIVE
Bacteria, UA: NONE SEEN
Glucose, UA: NEGATIVE mg/dL
KETONES UR: NEGATIVE mg/dL
Nitrite: NEGATIVE
PROTEIN: NEGATIVE mg/dL
Specific Gravity, Urine: 1.01 (ref 1.005–1.030)
pH: 6 (ref 5.0–8.0)

## 2015-12-19 NOTE — Progress Notes (Signed)
Patient called in and states that she is having some hematuria. Denies all other symptoms. Orders placed at this time for UA and UCS. Patient to be seen in office tomorrow.

## 2015-12-20 ENCOUNTER — Ambulatory Visit (INDEPENDENT_AMBULATORY_CARE_PROVIDER_SITE_OTHER): Payer: BC Managed Care – PPO | Admitting: General Surgery

## 2015-12-20 ENCOUNTER — Encounter: Payer: Self-pay | Admitting: General Surgery

## 2015-12-20 VITALS — BP 142/83 | HR 89 | Temp 97.4°F | Ht 62.0 in | Wt 167.4 lb

## 2015-12-20 DIAGNOSIS — Z4889 Encounter for other specified surgical aftercare: Secondary | ICD-10-CM

## 2015-12-20 NOTE — Progress Notes (Signed)
Outpatient Surgical Follow Up  12/20/2015  Chelsea Conner is an 60 y.o. female.   Chief Complaint  Patient presents with  . Routine Post Op    Laparoscopic Appendectomy (12/09/15)- Dr. Azalee Course    HPI: 60 year old female returns to clinic for additional follow-up status post laparoscopic appendectomy. Patient reports doing much better than her last visit. She denies any fevers, chills, nausea, vomiting, diarrhea,. She's not having any abdominal pain she's been very happy with her surgical experience.  Past Medical History  Diagnosis Date  . Asthma   . Endometriosis 1990  . Retinal detachment 1986    Past Surgical History  Procedure Laterality Date  . Breast cyst aspiration Left     neg  . Breast biopsy Bilateral 98 &2000    RT /CLIP, LT- NO CLIP- BOTH NEG  . Laparoscopic appendectomy N/A 12/09/2015    Procedure: APPENDECTOMY LAPAROSCOPIC;  Surgeon: Jules Husbands, MD;  Location: [facility] ORS;  Service: General;  Laterality: N/A;  . Abdominal hysterectomy  2000    Family History  Problem Relation Age of Onset  . Heart disease Mother     Social History:  reports that she has never smoked. She has never used smokeless tobacco. She reports that she does not drink alcohol or use illicit drugs.  Allergies:    Medications reviewed.    ROS A multipoint review of systems has been completed. All pertinent positives and negatives are documented in the history of present illness and remainder negative.   BP 142/83 mmHg  Pulse 89  Temp(Src) 97.4 F (36.3 C) (Oral)  Ht 5\' 2"  (1.575 m)  Wt 75.932 kg (167 lb 6.4 oz)  BMI 30.61 kg/m2  Physical Exam Gen.: No acute distress Chest: Clear to auscultation Heart: Regular rhythm Abdomen: Soft, nontender, nondistended. Well approximated lap scopic incision sites that evidence of erythema or drainage.    Results for orders placed or performed during the hospital encounter of 12/19/15 (from the past 48 hour(s))  Urinalysis  complete, with microscopic ([facility] only)     Status: Abnormal   Collection Time: 12/19/15  1:00 PM  Result Value Ref Range   Color, Urine STRAW (A) YELLOW   APPearance CLEAR CLEAR   Glucose, UA NEGATIVE NEGATIVE mg/dL   Bilirubin Urine NEGATIVE NEGATIVE   Ketones, ur NEGATIVE NEGATIVE mg/dL   Specific Gravity, Urine 1.010 1.005 - 1.030   Hgb urine dipstick 2+ (A) NEGATIVE   pH 6.0 5.0 - 8.0   Protein, ur NEGATIVE NEGATIVE mg/dL   Nitrite NEGATIVE NEGATIVE   Leukocytes, UA TRACE (A) NEGATIVE   RBC / HPF 6-30 0 - 5 RBC/hpf   WBC, UA 0-5 0 - 5 WBC/hpf   Bacteria, UA NONE SEEN NONE SEEN   Squamous Epithelial / LPF 0-5 (A) NONE SEEN  Urine culture     Status: None (Preliminary result)   Collection Time: 12/19/15  1:00 PM  Result Value Ref Range   Specimen Description URINE, CLEAN CATCH    Special Requests NONE    Culture NO GROWTH < 12 HOURS    Report Status PENDING    No results found.  Assessment/Plan:  1. Aftercare following surgery 60 year old female status post lap scopic appendectomy. Doing well. Discussed anticipated recovery and wound care instructions. Patient voiced understanding. We'll provide her with a note for return to work and she will follow up on an as-needed basis.     Clayburn Pert, MD FACS General Surgeon  12/20/2015,3:21 PM

## 2015-12-20 NOTE — Patient Instructions (Signed)
Please call our office with any questions or concerns.  Please do not submerge in a tub, hot tub, or pool until incisions are completely sealed.  Use sun block to incision area over the next year if this area will be exposed to sun. This helps decrease scarring.  You may now resume your normal activities with restrictions of no lifting greater than 15 lbs. Until 01/20/16. Listen to your body when lifting, if you have pain when lifting, stop and then try again in a few days.  If you develop redness, drainage, or pain at incision sites- call our office immediately and speak with a nurse.

## 2015-12-21 LAB — URINE CULTURE: CULTURE: NO GROWTH

## 2015-12-24 ENCOUNTER — Encounter: Payer: Self-pay | Admitting: Surgery

## 2016-05-08 ENCOUNTER — Other Ambulatory Visit: Payer: Self-pay | Admitting: Obstetrics and Gynecology

## 2016-05-08 DIAGNOSIS — Z1231 Encounter for screening mammogram for malignant neoplasm of breast: Secondary | ICD-10-CM

## 2016-06-12 ENCOUNTER — Ambulatory Visit
Admission: RE | Admit: 2016-06-12 | Discharge: 2016-06-12 | Disposition: A | Discharge status: Home / Self Care | Payer: BC Managed Care – PPO | Source: Ambulatory Visit | Attending: Obstetrics and Gynecology | Admitting: Obstetrics and Gynecology

## 2016-06-12 DIAGNOSIS — Z1231 Encounter for screening mammogram for malignant neoplasm of breast: Secondary | ICD-10-CM | POA: Diagnosis not present

## 2017-05-13 ENCOUNTER — Other Ambulatory Visit: Payer: Self-pay | Admitting: Obstetrics and Gynecology

## 2017-05-13 DIAGNOSIS — Z1231 Encounter for screening mammogram for malignant neoplasm of breast: Secondary | ICD-10-CM

## 2017-06-15 ENCOUNTER — Ambulatory Visit: Payer: BC Managed Care – PPO

## 2017-06-24 ENCOUNTER — Ambulatory Visit
Admission: RE | Admit: 2017-06-24 | Discharge: 2017-06-24 | Disposition: A | Discharge status: Home / Self Care | Payer: BC Managed Care – PPO | Source: Ambulatory Visit | Attending: Obstetrics and Gynecology | Admitting: Obstetrics and Gynecology

## 2017-06-24 DIAGNOSIS — Z1231 Encounter for screening mammogram for malignant neoplasm of breast: Secondary | ICD-10-CM | POA: Insufficient documentation

## 2018-05-28 ENCOUNTER — Other Ambulatory Visit: Payer: Self-pay | Admitting: Obstetrics and Gynecology

## 2018-05-28 DIAGNOSIS — Z1231 Encounter for screening mammogram for malignant neoplasm of breast: Secondary | ICD-10-CM

## 2018-06-28 ENCOUNTER — Ambulatory Visit
Admission: RE | Admit: 2018-06-28 | Discharge: 2018-06-28 | Disposition: A | Discharge status: Home / Self Care | Payer: BC Managed Care – PPO | Source: Ambulatory Visit | Attending: Obstetrics and Gynecology | Admitting: Obstetrics and Gynecology

## 2018-06-28 DIAGNOSIS — Z1231 Encounter for screening mammogram for malignant neoplasm of breast: Secondary | ICD-10-CM

## 2019-05-31 ENCOUNTER — Other Ambulatory Visit: Payer: Self-pay | Admitting: Obstetrics and Gynecology

## 2019-05-31 DIAGNOSIS — Z1231 Encounter for screening mammogram for malignant neoplasm of breast: Secondary | ICD-10-CM

## 2019-07-04 ENCOUNTER — Other Ambulatory Visit: Payer: Self-pay

## 2019-07-04 ENCOUNTER — Ambulatory Visit
Admission: RE | Admit: 2019-07-04 | Discharge: 2019-07-04 | Disposition: A | Discharge status: Home / Self Care | Payer: BC Managed Care – PPO | Source: Ambulatory Visit | Attending: Obstetrics and Gynecology | Admitting: Obstetrics and Gynecology

## 2019-07-04 DIAGNOSIS — Z1231 Encounter for screening mammogram for malignant neoplasm of breast: Secondary | ICD-10-CM | POA: Insufficient documentation

## 2020-05-31 ENCOUNTER — Other Ambulatory Visit: Payer: Self-pay | Admitting: Obstetrics and Gynecology

## 2020-05-31 DIAGNOSIS — Z1231 Encounter for screening mammogram for malignant neoplasm of breast: Secondary | ICD-10-CM

## 2020-07-04 ENCOUNTER — Ambulatory Visit
Admission: RE | Admit: 2020-07-04 | Discharge: 2020-07-04 | Disposition: A | Discharge status: Home / Self Care | Payer: BC Managed Care – PPO | Source: Ambulatory Visit | Attending: Obstetrics and Gynecology | Admitting: Obstetrics and Gynecology

## 2020-07-04 ENCOUNTER — Other Ambulatory Visit: Payer: Self-pay

## 2020-07-04 DIAGNOSIS — Z1231 Encounter for screening mammogram for malignant neoplasm of breast: Secondary | ICD-10-CM | POA: Diagnosis present

## 2021-07-23 ENCOUNTER — Other Ambulatory Visit: Payer: Self-pay | Admitting: Obstetrics and Gynecology

## 2021-07-23 DIAGNOSIS — Z1231 Encounter for screening mammogram for malignant neoplasm of breast: Secondary | ICD-10-CM

## 2021-08-12 ENCOUNTER — Ambulatory Visit
Admission: RE | Admit: 2021-08-12 | Discharge: 2021-08-12 | Disposition: A | Discharge status: Home / Self Care | Payer: BC Managed Care – PPO | Source: Ambulatory Visit | Attending: Obstetrics and Gynecology | Admitting: Obstetrics and Gynecology

## 2021-08-12 ENCOUNTER — Other Ambulatory Visit: Payer: Self-pay

## 2021-08-12 DIAGNOSIS — Z1231 Encounter for screening mammogram for malignant neoplasm of breast: Secondary | ICD-10-CM | POA: Diagnosis present

## 2021-10-18 IMAGING — MG DIGITAL SCREENING BILAT W/ TOMO W/ CAD
8 series · 8 of 24 positions shown · non-contrast
Comparison: Previous exam(s).

CLINICAL DATA: Screening.

EXAM:
DIGITAL SCREENING BILATERAL MAMMOGRAM WITH TOMO AND CAD

[R CC synth-2D]
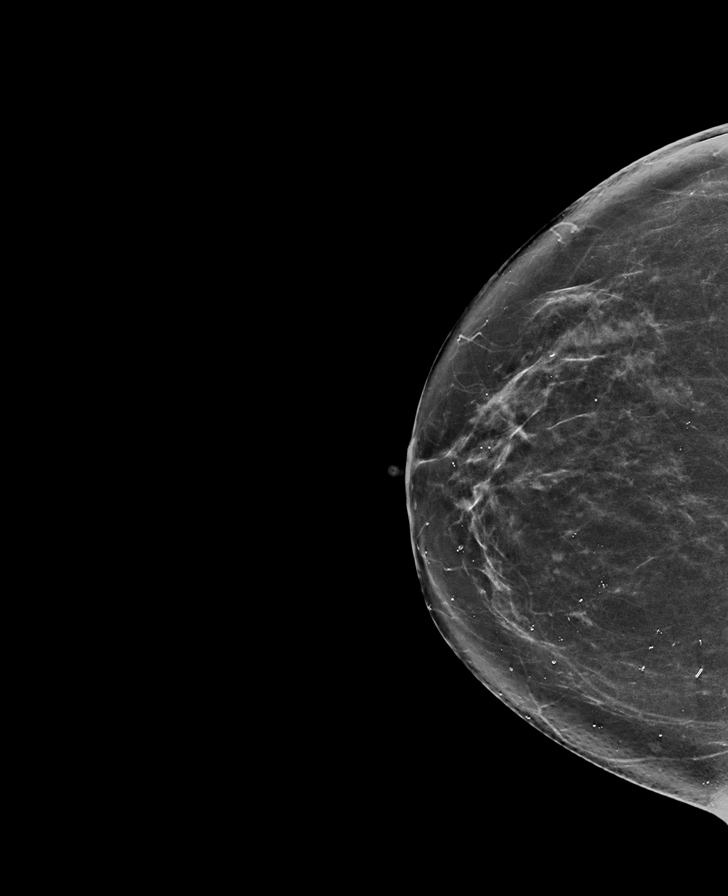

[R MLO synth-2D (1 of 2)]
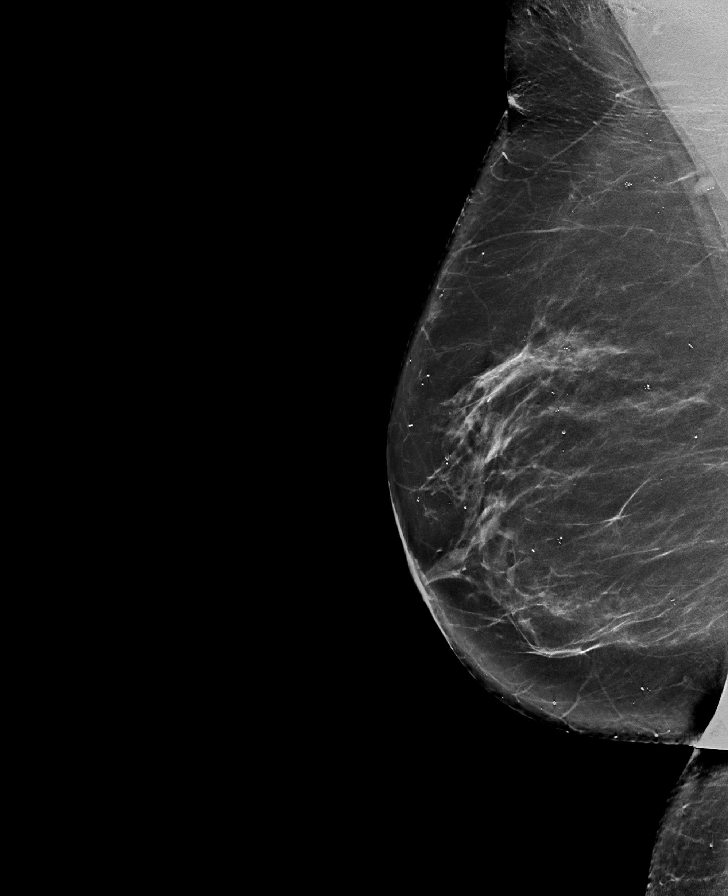

[R MLO synth-2D (2 of 2)]
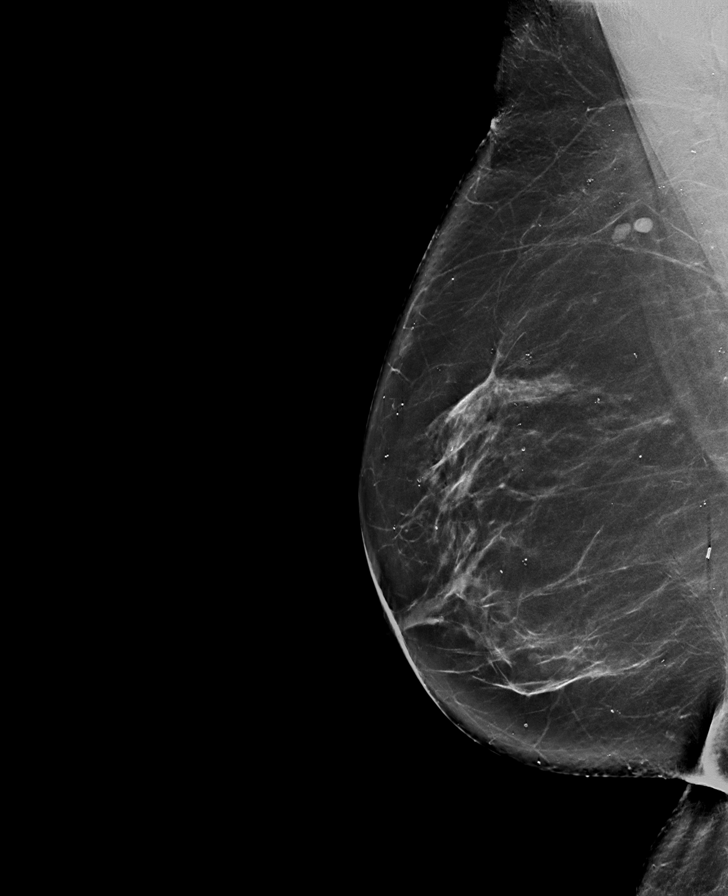

[L CC synth-2D]
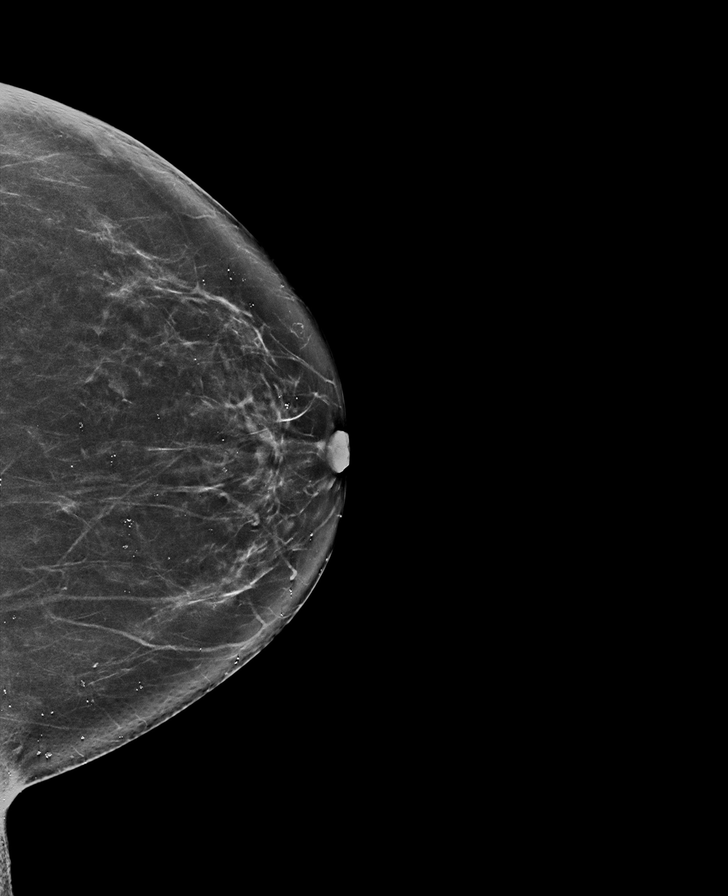

[R CC tomo · tomo slice 37/73.0]
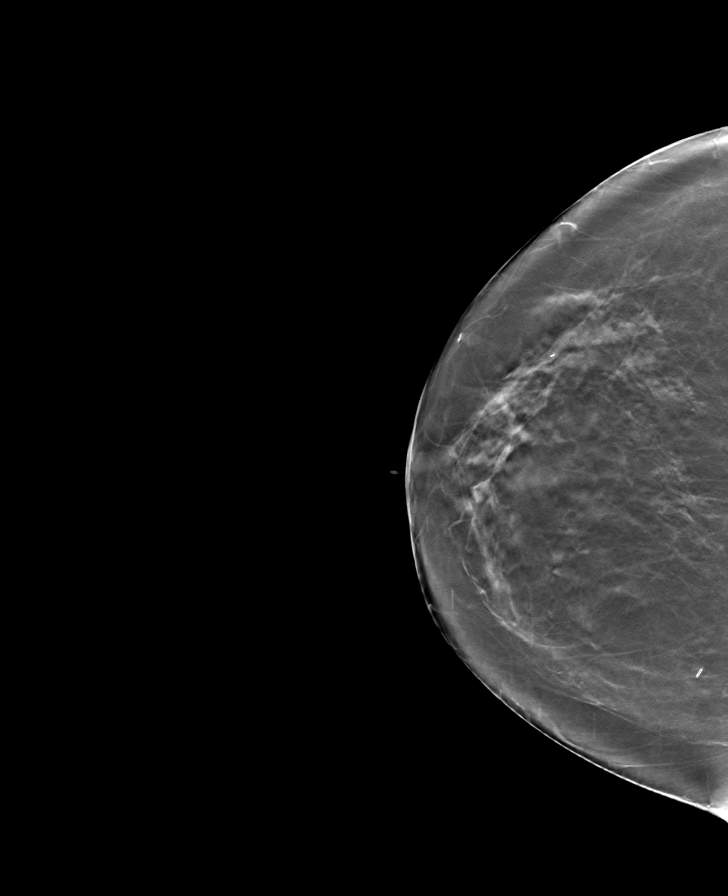

[L MLO tomo · tomo slice 42/83.0]
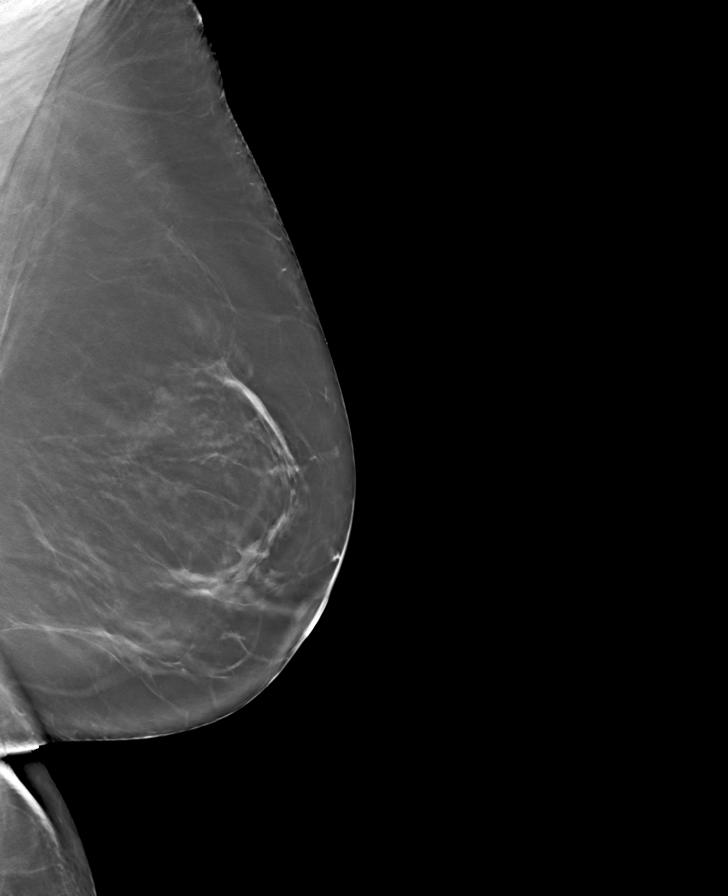

[R MLO tomo · tomo slice 41/81.0]
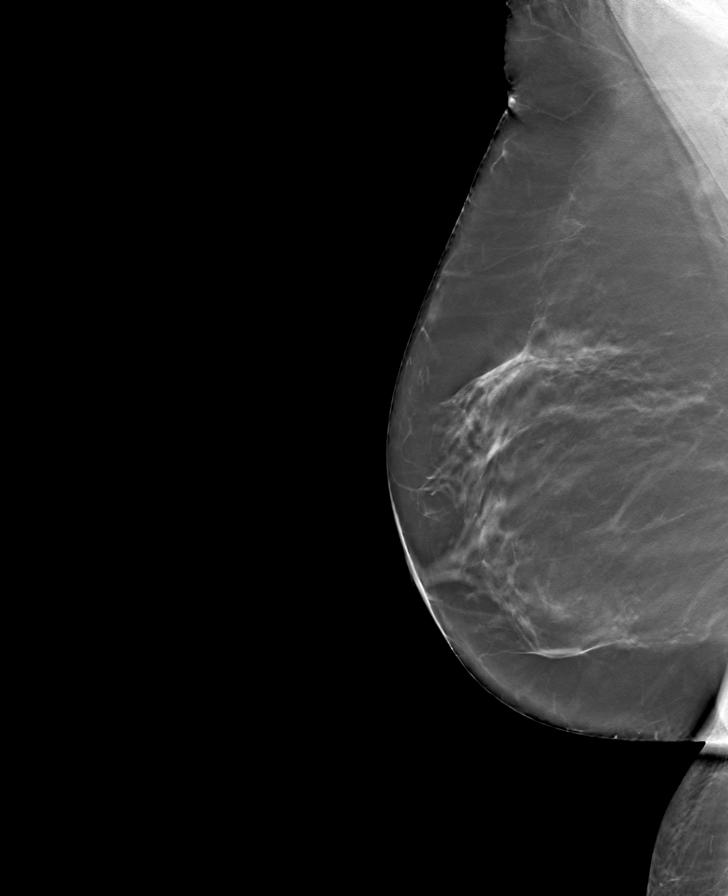

[L CC tomo · tomo slice 39/76.0]
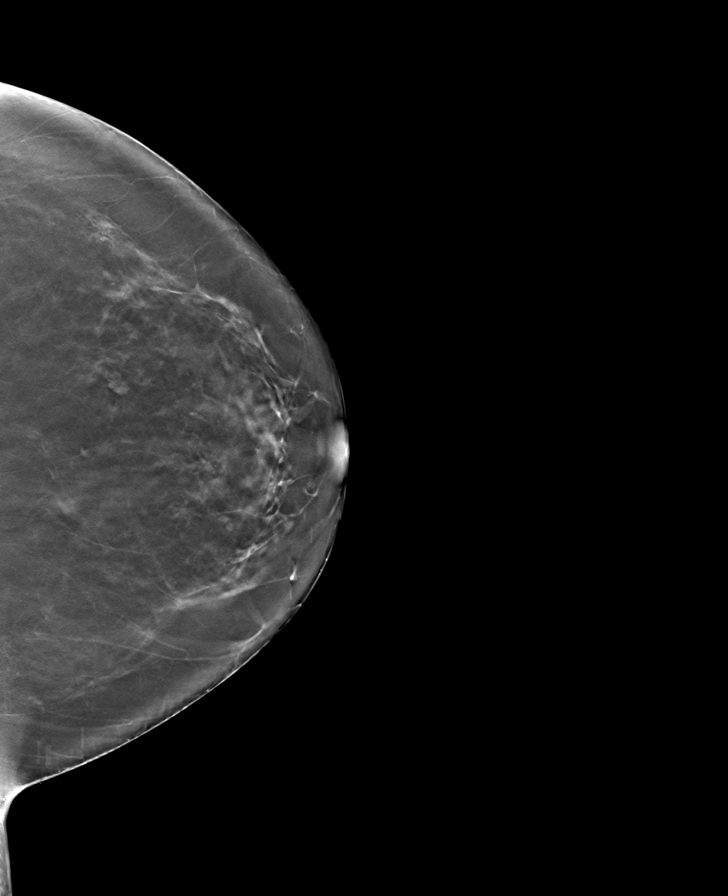

[8 of 24 positions shown; findings below may reference images not displayed]

ACR Breast Density Category b: There are scattered areas of
fibroglandular density.
FINDINGS: There are no findings suspicious for malignancy. Images were
processed with CAD.
IMPRESSION: No mammographic evidence of malignancy. A result letter of this
screening mammogram will be mailed directly to the patient.

RECOMMENDATION:
Screening mammogram in one year. (Code:[id number]-775)

BI-RADS CATEGORY  1: Negative.

## 2022-03-19 ENCOUNTER — Encounter: Payer: Self-pay | Admitting: Internal Medicine

## 2022-03-19 ENCOUNTER — Encounter
Admission: RE | Disposition: A | Discharge status: Home / Self Care | Payer: Self-pay | Source: Home / Self Care | Attending: Internal Medicine

## 2022-03-19 ENCOUNTER — Ambulatory Visit: Payer: Medicare PPO | Admitting: Anesthesiology

## 2022-03-19 ENCOUNTER — Ambulatory Visit
Admission: RE | Admit: 2022-03-19 | Discharge: 2022-03-19 | Disposition: A | Discharge status: Home / Self Care | Payer: Medicare PPO | Attending: Internal Medicine | Admitting: Internal Medicine

## 2022-03-19 DIAGNOSIS — K5730 Diverticulosis of large intestine without perforation or abscess without bleeding: Secondary | ICD-10-CM | POA: Insufficient documentation

## 2022-03-19 DIAGNOSIS — J45909 Unspecified asthma, uncomplicated: Secondary | ICD-10-CM | POA: Insufficient documentation

## 2022-03-19 DIAGNOSIS — K219 Gastro-esophageal reflux disease without esophagitis: Secondary | ICD-10-CM | POA: Insufficient documentation

## 2022-03-19 DIAGNOSIS — Z86010 Personal history of colonic polyps: Secondary | ICD-10-CM | POA: Insufficient documentation

## 2022-03-19 DIAGNOSIS — K640 First degree hemorrhoids: Secondary | ICD-10-CM | POA: Insufficient documentation

## 2022-03-19 DIAGNOSIS — D128 Benign neoplasm of rectum: Secondary | ICD-10-CM | POA: Insufficient documentation

## 2022-03-19 DIAGNOSIS — Z90710 Acquired absence of both cervix and uterus: Secondary | ICD-10-CM | POA: Insufficient documentation

## 2022-03-19 DIAGNOSIS — Z1211 Encounter for screening for malignant neoplasm of colon: Secondary | ICD-10-CM | POA: Diagnosis present

## 2022-03-19 HISTORY — PX: COLONOSCOPY WITH PROPOFOL: SHX5780

## 2022-03-19 SURGERY — COLONOSCOPY WITH PROPOFOL
Anesthesia: General

## 2022-03-19 MED ORDER — PROPOFOL 500 MG/50ML IV EMUL
INTRAVENOUS | Status: DC | PRN
  Administered 2022-03-19: 150 ug/kg/min via INTRAVENOUS

## 2022-03-19 MED ORDER — SODIUM CHLORIDE 0.9 % IV SOLN: INTRAVENOUS | Status: DC

## 2022-03-19 MED ORDER — PROPOFOL 1000 MG/100ML IV EMUL
INTRAVENOUS | Status: AC
  Filled 2022-03-19: qty 100

## 2022-03-19 NOTE — H&P (Signed)
Outpatient short stay form Pre-procedure 03/19/2022 11:19 AM Chelsea Conner, M.D.  Primary Physician: Chelsea Conner, M.D.  Reason for visit:  Personal history of adenomatous colon polyps  History of present illness:                            Patient presents for colonoscopy for a personal hx of colon polyps. The patient denies abdominal pain, abnormal weight loss or rectal bleeding.      Current Facility-Administered Medications:    0.9 %  sodium chloride infusion, , Intravenous, Continuous, Chelsea Conner, Chelsea Pike, MD, Last Rate: 20 mL/hr at 03/19/22 1059, Continued from Pre-op at 03/19/22 1059  Medications Prior to Admission  Medication Sig Dispense Refill Last Dose   calcium carbonate (CALCIUM 600) 600 MG TABS tablet Take 1 tablet by mouth daily.   Past Week   losartan (COZAAR) 25 MG tablet Take 25 mg by mouth daily.   03/19/2022 at 0500   acetaminophen (TYLENOL) 500 MG tablet Take 1,000 mg by mouth every 6 (six) hours as needed.      albuterol (PROAIR HFA) 108 (90 Base) MCG/ACT inhaler Inhale 1-2 puffs into the lungs every 6 (six) hours as needed.      Loratadine 10 MG CAPS Take 10 mg by mouth daily.      oxyCODONE-acetaminophen (ROXICET) 5-325 MG tablet Take 1-2 tablets by mouth every 4 (four) hours as needed for moderate pain or severe pain. 30 tablet 0    triamcinolone (NASACORT) 55 MCG/ACT AERO nasal inhaler Place 2 sprays into the nose daily.      venlafaxine XR (EFFEXOR-XR) 37.5 MG 24 hr capsule Take 4 capsules by mouth daily.  11 03/17/2022        Past Medical History:  Diagnosis Date   Asthma    Endometriosis 1990   Retinal detachment 1986    Review of systems:  Otherwise negative.    Physical Exam  Gen: Alert, oriented. Appears stated age.  HEENT: [place]/AT. PERRLA. Lungs: CTA, no wheezes. CV: RR nl S1, S2. Abd: soft, benign, no masses. BS+ Ext: No edema. Pulses 2+    Planned procedures: Proceed with colonoscopy. The patient understands the nature of the  planned procedure, indications, risks, alternatives and potential complications including but not limited to bleeding, infection, perforation, damage to internal organs and possible oversedation/side effects from anesthesia. The patient agrees and gives consent to proceed.  Please refer to procedure notes for findings, recommendations and patient disposition/instructions.     Chelsea Conner, M.D. Gastroenterology 03/19/2022  11:19 AM

## 2022-03-19 NOTE — Transfer of Care (Signed)
Immediate Anesthesia Transfer of Care Note  Patient: Chelsea Conner  Procedure(s) Performed: COLONOSCOPY WITH PROPOFOL  Patient Location: PACU  Anesthesia Type:General  Level of Consciousness: awake and sedated  Airway & Oxygen Therapy: Patient Spontanous Breathing and Patient connected to nasal cannula oxygen  Post-op Assessment: Report given to RN and Post -op Vital signs reviewed and stable  Post vital signs: Reviewed and stable  Last Vitals:  Vitals Value Taken Time  BP    Temp    Pulse    Resp    SpO2      Last Pain:  Vitals:   03/19/22 0955  TempSrc: Temporal  PainSc: 0-No pain         Complications: No notable events documented.

## 2022-03-19 NOTE — Anesthesia Postprocedure Evaluation (Signed)
Anesthesia Post Note  Patient: Chelsea Conner  Procedure(s) Performed: COLONOSCOPY WITH PROPOFOL  Patient location during evaluation: Endoscopy Anesthesia Type: General Level of consciousness: awake and alert Pain management: pain level controlled Vital Signs Assessment: post-procedure vital signs reviewed and stable Respiratory status: spontaneous breathing, nonlabored ventilation, respiratory function stable and patient connected to nasal cannula oxygen Cardiovascular status: blood pressure returned to baseline and stable Postop Assessment: no apparent nausea or vomiting Anesthetic complications: no   No notable events documented.   Last Vitals:  Vitals:   03/19/22 1149 03/19/22 1159  BP: 117/75 115/61  Pulse: 72 74  Resp: 12 16  Temp: (!) 36.1 C   SpO2: 94% 100%    Last Pain:  Vitals:   03/19/22 1209  TempSrc:   PainSc: 0-No pain                 Dimas Millin

## 2022-03-19 NOTE — Anesthesia Procedure Notes (Signed)
Date/Time: 03/19/2022 11:32 AM  Performed by: Donalda Ewings, CindyPre-anesthesia Checklist: Patient identified, Emergency Drugs available, Suction available, Patient being monitored and Timeout performed Patient Re-evaluated:Patient Re-evaluated prior to induction Oxygen Delivery Method: Nasal cannula Preoxygenation: Pre-oxygenation with 100% oxygen Induction Type: IV induction Placement Confirmation: positive ETCO2 and CO2 detector

## 2022-03-19 NOTE — Interval H&P Note (Signed)
History and Physical Interval Note:  03/19/2022 11:20 AM  Chelsea Conner  has presented today for surgery, with the diagnosis of COLON CANCER SCREENING.  The various methods of treatment have been discussed with the patient and family. After consideration of risks, benefits and other options for treatment, the patient has consented to  Procedure(s): COLONOSCOPY WITH PROPOFOL (N/A) as a surgical intervention.  The patient's history has been reviewed, patient examined, no change in status, stable for surgery.  I have reviewed the patient's chart and labs.  Questions were answered to the patient's satisfaction.     [place], [place]

## 2022-03-19 NOTE — Op Note (Signed)
[facility] Gastroenterology Patient Name: Chelsea Conner Procedure Date: 03/19/2022 11:17 AM MRN: [id number] Account #: [id number] Date of Birth: November 26, 1955 Admit Type: Outpatient Age: 66 Room: [facility] ENDO ROOM 2 Gender: Female Note Status: Finalized Instrument Name: Park Meo [id number] Procedure:             Colonoscopy Indications:           High risk colon cancer surveillance: Personal history                         of sessile serrated colon polyp (less than 10 mm in                         size) with no dysplasia Providers:             Benay Pike. Alice Reichert MD, MD Referring MD:          Benay Pike. Alice Reichert MD, MD (Referring MD), Sofie Hartigan (Referring MD) Medicines:             Propofol per Anesthesia Complications:         No immediate complications. Procedure:             Pre-Anesthesia Assessment:                        - The risks and benefits of the procedure and the                         sedation options and risks were discussed with the                         patient. All questions were answered and informed                         consent was obtained.                        - Patient identification and proposed procedure were                         verified prior to the procedure by the nurse. The                         procedure was verified in the procedure room.                        - ASA Grade Assessment: III - A patient with severe                         systemic disease.                        - After reviewing the risks and benefits, the patient                         was deemed in satisfactory condition to undergo the  procedure.                        After obtaining informed consent, the colonoscope was                         passed under direct vision. Throughout the procedure,                         the patient's blood pressure, pulse, and oxygen                          saturations were monitored continuously. The                         Colonoscope was introduced through the anus and                         advanced to the the cecum, identified by appendiceal                         orifice and ileocecal valve. The colonoscopy was                         performed without difficulty. The patient tolerated                         the procedure well. The quality of the bowel                         preparation was good. The ileocecal valve, appendiceal                         orifice, and rectum were photographed. Findings:      The perianal and digital rectal examinations were normal. Pertinent       negatives include normal sphincter tone and no palpable rectal lesions.      Non-bleeding internal hemorrhoids were found during retroflexion. The       hemorrhoids were Grade I (internal hemorrhoids that do not prolapse).      A single large-mouthed diverticulum was found in the transverse colon.      A 10 mm polyp was found in the rectum. The polyp was semi-pedunculated.       The polyp was removed with a hot snare. Resection and retrieval were       complete. Coagulation for destruction of remaining portion of lesion       using snare was successful.      The exam was otherwise without abnormality. Impression:            - Non-bleeding internal hemorrhoids.                        - Diverticulosis in the transverse colon.                        - One 10 mm polyp in the rectum, removed with a hot                         snare. Resected and retrieved. Treated with a hot  snare.                        - The examination was otherwise normal. Recommendation:        - Patient has a contact number available for                         emergencies. The signs and symptoms of potential                         delayed complications were discussed with the patient.                         Return to normal activities tomorrow. Written                          discharge instructions were provided to the patient.                        - Resume previous diet.                        - Continue present medications.                        - Repeat colonoscopy is recommended for surveillance.                         The colonoscopy date will be determined after                         pathology results from today's exam become available                         for review.                        - Return to GI office PRN.                        - The findings and recommendations were discussed with                         the patient. Procedure Code(s):     --- Professional ---                        [id number], Colonoscopy, flexible; with removal of                         tumor(s), polyp(s), or other lesion(s) by snare                         technique Diagnosis Code(s):     --- Professional ---                        K57.30, Diverticulosis of large intestine without                         perforation or abscess without bleeding  K62.1, Rectal polyp                        K64.0, First degree hemorrhoids                        Z86.010, Personal history of colonic polyps CPT copyright 2019 [employer]. All rights reserved. The codes documented in this report are preliminary and upon coder review may  be revised to meet current compliance requirements. Efrain Sella MD, MD 03/19/2022 11:51:32 AM This report has been signed electronically. Number of Addenda: 0 Note Initiated On: 03/19/2022 11:17 AM Scope Withdrawal Time: 0 hours 9 minutes 54 seconds  Total Procedure Duration: 0 hours 13 minutes 34 seconds  Estimated Blood Loss:  Estimated blood loss: none.      [facility]

## 2022-03-19 NOTE — Anesthesia Preprocedure Evaluation (Signed)
Anesthesia Evaluation  Patient identified by MRN, date of birth, ID band Patient awake    Reviewed: Allergy & Precautions, NPO status , Patient's Chart, lab work & pertinent test results  Airway Mallampati: II  TM Distance: >3 FB Neck ROM: full    Dental no notable dental hx. (+) Chipped   Pulmonary neg pulmonary ROS, asthma ,  Sinus allergies   Pulmonary exam normal        Cardiovascular negative cardio ROS Normal cardiovascular exam     Neuro/Psych negative neurological ROS  negative psych ROS   GI/Hepatic Neg liver ROS, GERD  ,Acute appendix   Endo/Other  negative endocrine ROS  Renal/GU negative Renal ROS  negative genitourinary   Musculoskeletal negative musculoskeletal ROS (+)   Abdominal Normal abdominal exam  (+)   Peds negative pediatric ROS (+)  Hematology negative hematology ROS (+)   Anesthesia Other Findings Past Medical History: No date: Asthma 1990: Endometriosis 1986: Retinal detachment  Past Surgical History: 2000: ABDOMINAL HYSTERECTOMY No date: APPENDECTOMY 98 &2000: BREAST BIOPSY; Bilateral     Comment:  RT /CLIP, LT- NO CLIP- BOTH NEG No date: BREAST CYST ASPIRATION; Left     Comment:  neg 12/09/2015: LAPAROSCOPIC APPENDECTOMY; N/A     Comment:  Procedure: APPENDECTOMY LAPAROSCOPIC;  Surgeon: Jules Husbands, MD;  Location: [facility] ORS;  Service: General;                Laterality: N/A; No date: OOPHORECTOMY     Comment:  1 ovary removed ? side  BMI    Body Mass Index: 32.12 kg/m      Reproductive/Obstetrics negative OB ROS                             Anesthesia Physical  Anesthesia Plan  ASA: 2 and emergent  Anesthesia Plan: General   Post-op Pain Management:    Induction: Intravenous, Rapid sequence and Cricoid pressure planned  PONV Risk Score and Plan: 3 and Propofol infusion and TIVA  Airway Management Planned: Nasal  Cannula  Additional Equipment:   Intra-op Plan:   Post-operative Plan: Extubation in OR  Informed Consent: I have reviewed the patients History and Physical, chart, labs and discussed the procedure including the risks, benefits and alternatives for the proposed anesthesia with the patient or authorized representative who has indicated his/her understanding and acceptance.     Dental advisory given  Plan Discussed with: CRNA and Surgeon  Anesthesia Plan Comments: (Patient consented for risks of anesthesia including but not limited to:  - adverse reactions to medications - risk of airway placement if required - damage to eyes, teeth, lips or other oral mucosa - nerve damage due to positioning  - sore throat or hoarseness - Damage to heart, brain, nerves, lungs, other parts of body or loss of life  Patient voiced understanding.)        Anesthesia Quick Evaluation

## 2022-03-20 ENCOUNTER — Encounter: Payer: Self-pay | Admitting: Internal Medicine

## 2022-03-20 LAB — SURGICAL PATHOLOGY

## 2022-08-11 DIAGNOSIS — Z1272 Encounter for screening for malignant neoplasm of vagina: Secondary | ICD-10-CM | POA: Diagnosis not present

## 2022-08-11 DIAGNOSIS — Z1331 Encounter for screening for depression: Secondary | ICD-10-CM | POA: Diagnosis not present

## 2022-08-11 DIAGNOSIS — Z1231 Encounter for screening mammogram for malignant neoplasm of breast: Secondary | ICD-10-CM | POA: Diagnosis not present

## 2022-08-11 DIAGNOSIS — R87625 Unsatisfactory cytologic smear of vagina: Secondary | ICD-10-CM | POA: Diagnosis not present

## 2022-08-11 DIAGNOSIS — Z124 Encounter for screening for malignant neoplasm of cervix: Secondary | ICD-10-CM | POA: Diagnosis not present

## 2022-08-13 ENCOUNTER — Other Ambulatory Visit: Payer: Self-pay | Admitting: Obstetrics and Gynecology

## 2022-08-13 DIAGNOSIS — Z1231 Encounter for screening mammogram for malignant neoplasm of breast: Secondary | ICD-10-CM

## 2022-08-14 DIAGNOSIS — F329 Major depressive disorder, single episode, unspecified: Secondary | ICD-10-CM | POA: Diagnosis not present

## 2022-08-14 DIAGNOSIS — Z880 Allergy status to penicillin: Secondary | ICD-10-CM | POA: Diagnosis not present

## 2022-08-14 DIAGNOSIS — E785 Hyperlipidemia, unspecified: Secondary | ICD-10-CM | POA: Diagnosis not present

## 2022-08-14 DIAGNOSIS — E669 Obesity, unspecified: Secondary | ICD-10-CM | POA: Diagnosis not present

## 2022-08-14 DIAGNOSIS — Z6833 Body mass index (BMI) 33.0-33.9, adult: Secondary | ICD-10-CM | POA: Diagnosis not present

## 2022-08-14 DIAGNOSIS — M8580 Other specified disorders of bone density and structure, unspecified site: Secondary | ICD-10-CM | POA: Diagnosis not present

## 2022-08-14 DIAGNOSIS — I10 Essential (primary) hypertension: Secondary | ICD-10-CM | POA: Diagnosis not present

## 2022-08-14 DIAGNOSIS — K219 Gastro-esophageal reflux disease without esophagitis: Secondary | ICD-10-CM | POA: Diagnosis not present

## 2022-09-03 DIAGNOSIS — M546 Pain in thoracic spine: Secondary | ICD-10-CM | POA: Diagnosis not present

## 2022-09-03 DIAGNOSIS — M9902 Segmental and somatic dysfunction of thoracic region: Secondary | ICD-10-CM | POA: Diagnosis not present

## 2022-09-03 DIAGNOSIS — M9903 Segmental and somatic dysfunction of lumbar region: Secondary | ICD-10-CM | POA: Diagnosis not present

## 2022-09-03 DIAGNOSIS — M531 Cervicobrachial syndrome: Secondary | ICD-10-CM | POA: Diagnosis not present

## 2022-09-03 DIAGNOSIS — M5441 Lumbago with sciatica, right side: Secondary | ICD-10-CM | POA: Diagnosis not present

## 2022-09-03 DIAGNOSIS — M9901 Segmental and somatic dysfunction of cervical region: Secondary | ICD-10-CM | POA: Diagnosis not present

## 2022-09-09 ENCOUNTER — Ambulatory Visit
Admission: RE | Admit: 2022-09-09 | Discharge: 2022-09-09 | Disposition: A | Discharge status: Home / Self Care | Payer: Medicare PPO | Source: Ambulatory Visit | Attending: Obstetrics and Gynecology | Admitting: Obstetrics and Gynecology

## 2022-09-09 DIAGNOSIS — Z1231 Encounter for screening mammogram for malignant neoplasm of breast: Secondary | ICD-10-CM | POA: Diagnosis not present

## 2022-09-29 DIAGNOSIS — D2272 Melanocytic nevi of left lower limb, including hip: Secondary | ICD-10-CM | POA: Diagnosis not present

## 2022-09-29 DIAGNOSIS — L578 Other skin changes due to chronic exposure to nonionizing radiation: Secondary | ICD-10-CM | POA: Diagnosis not present

## 2022-09-29 DIAGNOSIS — Z872 Personal history of diseases of the skin and subcutaneous tissue: Secondary | ICD-10-CM | POA: Diagnosis not present

## 2022-09-29 DIAGNOSIS — D2271 Melanocytic nevi of right lower limb, including hip: Secondary | ICD-10-CM | POA: Diagnosis not present

## 2022-10-15 DIAGNOSIS — M546 Pain in thoracic spine: Secondary | ICD-10-CM | POA: Diagnosis not present

## 2022-10-15 DIAGNOSIS — M5441 Lumbago with sciatica, right side: Secondary | ICD-10-CM | POA: Diagnosis not present

## 2022-10-15 DIAGNOSIS — M531 Cervicobrachial syndrome: Secondary | ICD-10-CM | POA: Diagnosis not present

## 2022-10-15 DIAGNOSIS — M9902 Segmental and somatic dysfunction of thoracic region: Secondary | ICD-10-CM | POA: Diagnosis not present

## 2022-10-15 DIAGNOSIS — M9901 Segmental and somatic dysfunction of cervical region: Secondary | ICD-10-CM | POA: Diagnosis not present

## 2022-10-15 DIAGNOSIS — M9903 Segmental and somatic dysfunction of lumbar region: Secondary | ICD-10-CM | POA: Diagnosis not present

## 2022-12-03 DIAGNOSIS — M5441 Lumbago with sciatica, right side: Secondary | ICD-10-CM | POA: Diagnosis not present

## 2022-12-03 DIAGNOSIS — M9901 Segmental and somatic dysfunction of cervical region: Secondary | ICD-10-CM | POA: Diagnosis not present

## 2022-12-03 DIAGNOSIS — M9903 Segmental and somatic dysfunction of lumbar region: Secondary | ICD-10-CM | POA: Diagnosis not present

## 2022-12-03 DIAGNOSIS — M531 Cervicobrachial syndrome: Secondary | ICD-10-CM | POA: Diagnosis not present

## 2022-12-03 DIAGNOSIS — M546 Pain in thoracic spine: Secondary | ICD-10-CM | POA: Diagnosis not present

## 2022-12-03 DIAGNOSIS — M9902 Segmental and somatic dysfunction of thoracic region: Secondary | ICD-10-CM | POA: Diagnosis not present

## 2022-12-26 DIAGNOSIS — Z8669 Personal history of other diseases of the nervous system and sense organs: Secondary | ICD-10-CM | POA: Diagnosis not present

## 2022-12-26 DIAGNOSIS — H2513 Age-related nuclear cataract, bilateral: Secondary | ICD-10-CM | POA: Diagnosis not present

## 2022-12-26 DIAGNOSIS — H43813 Vitreous degeneration, bilateral: Secondary | ICD-10-CM | POA: Diagnosis not present

## 2023-01-21 DIAGNOSIS — M9901 Segmental and somatic dysfunction of cervical region: Secondary | ICD-10-CM | POA: Diagnosis not present

## 2023-01-21 DIAGNOSIS — M9902 Segmental and somatic dysfunction of thoracic region: Secondary | ICD-10-CM | POA: Diagnosis not present

## 2023-01-21 DIAGNOSIS — M9903 Segmental and somatic dysfunction of lumbar region: Secondary | ICD-10-CM | POA: Diagnosis not present

## 2023-01-21 DIAGNOSIS — M546 Pain in thoracic spine: Secondary | ICD-10-CM | POA: Diagnosis not present

## 2023-01-21 DIAGNOSIS — M531 Cervicobrachial syndrome: Secondary | ICD-10-CM | POA: Diagnosis not present

## 2023-01-21 DIAGNOSIS — M5441 Lumbago with sciatica, right side: Secondary | ICD-10-CM | POA: Diagnosis not present

## 2023-03-17 DIAGNOSIS — H6123 Impacted cerumen, bilateral: Secondary | ICD-10-CM | POA: Diagnosis not present

## 2023-03-17 DIAGNOSIS — F419 Anxiety disorder, unspecified: Secondary | ICD-10-CM | POA: Diagnosis not present

## 2023-03-17 DIAGNOSIS — E559 Vitamin D deficiency, unspecified: Secondary | ICD-10-CM | POA: Diagnosis not present

## 2023-03-17 DIAGNOSIS — J4520 Mild intermittent asthma, uncomplicated: Secondary | ICD-10-CM | POA: Diagnosis not present

## 2023-03-17 DIAGNOSIS — J302 Other seasonal allergic rhinitis: Secondary | ICD-10-CM | POA: Diagnosis not present

## 2023-03-17 DIAGNOSIS — Z1331 Encounter for screening for depression: Secondary | ICD-10-CM | POA: Diagnosis not present

## 2023-03-17 DIAGNOSIS — Z0000 Encounter for general adult medical examination without abnormal findings: Secondary | ICD-10-CM | POA: Diagnosis not present

## 2023-03-17 DIAGNOSIS — M8589 Other specified disorders of bone density and structure, multiple sites: Secondary | ICD-10-CM | POA: Diagnosis not present

## 2023-03-17 DIAGNOSIS — K219 Gastro-esophageal reflux disease without esophagitis: Secondary | ICD-10-CM | POA: Diagnosis not present

## 2023-03-17 DIAGNOSIS — E781 Pure hyperglyceridemia: Secondary | ICD-10-CM | POA: Diagnosis not present

## 2023-03-17 DIAGNOSIS — I10 Essential (primary) hypertension: Secondary | ICD-10-CM | POA: Diagnosis not present

## 2023-03-17 DIAGNOSIS — E669 Obesity, unspecified: Secondary | ICD-10-CM | POA: Diagnosis not present

## 2023-03-18 ENCOUNTER — Other Ambulatory Visit: Payer: Self-pay | Admitting: Gerontology

## 2023-03-18 DIAGNOSIS — E049 Nontoxic goiter, unspecified: Secondary | ICD-10-CM

## 2023-03-19 ENCOUNTER — Ambulatory Visit
Admission: RE | Admit: 2023-03-19 | Discharge: 2023-03-19 | Disposition: A | Discharge status: Home / Self Care | Payer: Medicare PPO | Source: Ambulatory Visit | Attending: Gerontology | Admitting: Gerontology

## 2023-03-19 DIAGNOSIS — E042 Nontoxic multinodular goiter: Secondary | ICD-10-CM | POA: Diagnosis not present

## 2023-03-19 DIAGNOSIS — M9903 Segmental and somatic dysfunction of lumbar region: Secondary | ICD-10-CM | POA: Diagnosis not present

## 2023-03-19 DIAGNOSIS — E049 Nontoxic goiter, unspecified: Secondary | ICD-10-CM | POA: Diagnosis not present

## 2023-03-19 DIAGNOSIS — M9902 Segmental and somatic dysfunction of thoracic region: Secondary | ICD-10-CM | POA: Diagnosis not present

## 2023-03-19 DIAGNOSIS — M9901 Segmental and somatic dysfunction of cervical region: Secondary | ICD-10-CM | POA: Diagnosis not present

## 2023-03-19 DIAGNOSIS — M546 Pain in thoracic spine: Secondary | ICD-10-CM | POA: Diagnosis not present

## 2023-03-19 DIAGNOSIS — M5441 Lumbago with sciatica, right side: Secondary | ICD-10-CM | POA: Diagnosis not present

## 2023-03-19 DIAGNOSIS — M531 Cervicobrachial syndrome: Secondary | ICD-10-CM | POA: Diagnosis not present

## 2023-03-20 DIAGNOSIS — I10 Essential (primary) hypertension: Secondary | ICD-10-CM | POA: Diagnosis not present

## 2023-03-20 DIAGNOSIS — F419 Anxiety disorder, unspecified: Secondary | ICD-10-CM | POA: Diagnosis not present

## 2023-03-20 DIAGNOSIS — Z1331 Encounter for screening for depression: Secondary | ICD-10-CM | POA: Diagnosis not present

## 2023-03-20 DIAGNOSIS — E669 Obesity, unspecified: Secondary | ICD-10-CM | POA: Diagnosis not present

## 2023-03-20 DIAGNOSIS — K219 Gastro-esophageal reflux disease without esophagitis: Secondary | ICD-10-CM | POA: Diagnosis not present

## 2023-03-20 DIAGNOSIS — Z0000 Encounter for general adult medical examination without abnormal findings: Secondary | ICD-10-CM | POA: Diagnosis not present

## 2023-03-20 DIAGNOSIS — E781 Pure hyperglyceridemia: Secondary | ICD-10-CM | POA: Diagnosis not present

## 2023-03-20 DIAGNOSIS — Z131 Encounter for screening for diabetes mellitus: Secondary | ICD-10-CM | POA: Diagnosis not present

## 2023-03-20 DIAGNOSIS — H6123 Impacted cerumen, bilateral: Secondary | ICD-10-CM | POA: Diagnosis not present

## 2023-03-26 ENCOUNTER — Other Ambulatory Visit: Payer: Self-pay | Admitting: Gerontology

## 2023-03-26 DIAGNOSIS — E041 Nontoxic single thyroid nodule: Secondary | ICD-10-CM

## 2023-03-27 NOTE — Progress Notes (Signed)
Patient for US guided MId RT and Mid to Inferior LT Thyroid Nodule Biopsy on Monday 03/30/2023, I called and spoke with the patient on the phone and gave pre-procedure instructions. Pt was made aware to be here at 12:30p and check in at the [facility]. Pt stated understanding.  Called 03/26/2023

## 2023-03-30 ENCOUNTER — Ambulatory Visit
Admission: RE | Admit: 2023-03-30 | Discharge: 2023-03-30 | Disposition: A | Discharge status: Home / Self Care | Payer: Medicare PPO | Source: Ambulatory Visit | Attending: Gerontology | Admitting: Gerontology

## 2023-03-30 DIAGNOSIS — E041 Nontoxic single thyroid nodule: Secondary | ICD-10-CM | POA: Insufficient documentation

## 2023-03-30 DIAGNOSIS — E042 Nontoxic multinodular goiter: Secondary | ICD-10-CM | POA: Diagnosis not present

## 2023-03-30 MED ORDER — LIDOCAINE HCL (PF) 1 % IJ SOLN
10.0000 mL | Freq: Once | INTRAMUSCULAR | Status: AC
  Administered 2023-03-30: 10 mL via INTRADERMAL

## 2023-03-30 NOTE — Discharge Instructions (Signed)
Discharge instructions reviewed with patient.

## 2023-03-30 NOTE — Procedures (Signed)
PROCEDURE SUMMARY:  Using direct ultrasound guidance, 5 passes were made using 25 g needles into the nodule within the right mid lobe of the thyroid.   Using direct ultrasound guidance, 5 passes were made using 25 g needles into the nodule within the left mid-to-inferior lobe of the thyroid.   Ultrasound was used to confirm needle placements on all occasions.   EBL = trace  Specimens were sent to Pathology for analysis.  See procedure note under Imaging tab in Epic for full procedure details.  Kennieth Francois PA-C 03/30/2023 2:08 PM

## 2023-04-30 DIAGNOSIS — M9903 Segmental and somatic dysfunction of lumbar region: Secondary | ICD-10-CM | POA: Diagnosis not present

## 2023-04-30 DIAGNOSIS — M546 Pain in thoracic spine: Secondary | ICD-10-CM | POA: Diagnosis not present

## 2023-04-30 DIAGNOSIS — M5441 Lumbago with sciatica, right side: Secondary | ICD-10-CM | POA: Diagnosis not present

## 2023-04-30 DIAGNOSIS — M9901 Segmental and somatic dysfunction of cervical region: Secondary | ICD-10-CM | POA: Diagnosis not present

## 2023-04-30 DIAGNOSIS — M531 Cervicobrachial syndrome: Secondary | ICD-10-CM | POA: Diagnosis not present

## 2023-04-30 DIAGNOSIS — M9902 Segmental and somatic dysfunction of thoracic region: Secondary | ICD-10-CM | POA: Diagnosis not present

## 2023-06-09 DIAGNOSIS — M9901 Segmental and somatic dysfunction of cervical region: Secondary | ICD-10-CM | POA: Diagnosis not present

## 2023-06-09 DIAGNOSIS — M546 Pain in thoracic spine: Secondary | ICD-10-CM | POA: Diagnosis not present

## 2023-06-09 DIAGNOSIS — M531 Cervicobrachial syndrome: Secondary | ICD-10-CM | POA: Diagnosis not present

## 2023-06-09 DIAGNOSIS — M9902 Segmental and somatic dysfunction of thoracic region: Secondary | ICD-10-CM | POA: Diagnosis not present

## 2023-06-09 DIAGNOSIS — M5441 Lumbago with sciatica, right side: Secondary | ICD-10-CM | POA: Diagnosis not present

## 2023-06-09 DIAGNOSIS — M9903 Segmental and somatic dysfunction of lumbar region: Secondary | ICD-10-CM | POA: Diagnosis not present

## 2023-07-21 DIAGNOSIS — M9901 Segmental and somatic dysfunction of cervical region: Secondary | ICD-10-CM | POA: Diagnosis not present

## 2023-07-21 DIAGNOSIS — M5441 Lumbago with sciatica, right side: Secondary | ICD-10-CM | POA: Diagnosis not present

## 2023-07-21 DIAGNOSIS — M531 Cervicobrachial syndrome: Secondary | ICD-10-CM | POA: Diagnosis not present

## 2023-07-21 DIAGNOSIS — M9902 Segmental and somatic dysfunction of thoracic region: Secondary | ICD-10-CM | POA: Diagnosis not present

## 2023-07-21 DIAGNOSIS — M546 Pain in thoracic spine: Secondary | ICD-10-CM | POA: Diagnosis not present

## 2023-07-21 DIAGNOSIS — M9903 Segmental and somatic dysfunction of lumbar region: Secondary | ICD-10-CM | POA: Diagnosis not present

## 2023-08-31 DIAGNOSIS — M9903 Segmental and somatic dysfunction of lumbar region: Secondary | ICD-10-CM | POA: Diagnosis not present

## 2023-08-31 DIAGNOSIS — M546 Pain in thoracic spine: Secondary | ICD-10-CM | POA: Diagnosis not present

## 2023-08-31 DIAGNOSIS — M5441 Lumbago with sciatica, right side: Secondary | ICD-10-CM | POA: Diagnosis not present

## 2023-08-31 DIAGNOSIS — M9901 Segmental and somatic dysfunction of cervical region: Secondary | ICD-10-CM | POA: Diagnosis not present

## 2023-08-31 DIAGNOSIS — M9902 Segmental and somatic dysfunction of thoracic region: Secondary | ICD-10-CM | POA: Diagnosis not present

## 2023-08-31 DIAGNOSIS — M531 Cervicobrachial syndrome: Secondary | ICD-10-CM | POA: Diagnosis not present

## 2023-09-03 ENCOUNTER — Other Ambulatory Visit: Payer: Self-pay | Admitting: Obstetrics and Gynecology

## 2023-09-03 DIAGNOSIS — Z9189 Other specified personal risk factors, not elsewhere classified: Secondary | ICD-10-CM | POA: Diagnosis not present

## 2023-09-03 DIAGNOSIS — Z1231 Encounter for screening mammogram for malignant neoplasm of breast: Secondary | ICD-10-CM

## 2023-09-03 DIAGNOSIS — Z1331 Encounter for screening for depression: Secondary | ICD-10-CM | POA: Diagnosis not present

## 2023-09-03 DIAGNOSIS — Z01419 Encounter for gynecological examination (general) (routine) without abnormal findings: Secondary | ICD-10-CM | POA: Diagnosis not present

## 2023-09-15 ENCOUNTER — Inpatient Hospital Stay: Admission: RE | Admit: 2023-09-15 | Payer: Medicare PPO | Source: Ambulatory Visit

## 2023-09-23 ENCOUNTER — Inpatient Hospital Stay: Admission: RE | Admit: 2023-09-23 | Payer: Medicare PPO | Source: Ambulatory Visit

## 2023-09-29 ENCOUNTER — Ambulatory Visit
Admission: RE | Admit: 2023-09-29 | Discharge: 2023-09-29 | Disposition: A | Discharge status: Home / Self Care | Payer: Medicare PPO | Source: Ambulatory Visit | Attending: Obstetrics and Gynecology | Admitting: Obstetrics and Gynecology

## 2023-09-29 DIAGNOSIS — Z1231 Encounter for screening mammogram for malignant neoplasm of breast: Secondary | ICD-10-CM | POA: Diagnosis not present

## 2023-09-30 DIAGNOSIS — Z872 Personal history of diseases of the skin and subcutaneous tissue: Secondary | ICD-10-CM | POA: Diagnosis not present

## 2023-09-30 DIAGNOSIS — L578 Other skin changes due to chronic exposure to nonionizing radiation: Secondary | ICD-10-CM | POA: Diagnosis not present

## 2023-10-15 DIAGNOSIS — M9903 Segmental and somatic dysfunction of lumbar region: Secondary | ICD-10-CM | POA: Diagnosis not present

## 2023-10-15 DIAGNOSIS — M9902 Segmental and somatic dysfunction of thoracic region: Secondary | ICD-10-CM | POA: Diagnosis not present

## 2023-10-15 DIAGNOSIS — M546 Pain in thoracic spine: Secondary | ICD-10-CM | POA: Diagnosis not present

## 2023-10-15 DIAGNOSIS — M545 Low back pain, unspecified: Secondary | ICD-10-CM | POA: Diagnosis not present

## 2023-10-15 DIAGNOSIS — M9901 Segmental and somatic dysfunction of cervical region: Secondary | ICD-10-CM | POA: Diagnosis not present

## 2023-10-15 DIAGNOSIS — M531 Cervicobrachial syndrome: Secondary | ICD-10-CM | POA: Diagnosis not present

## 2023-12-10 DIAGNOSIS — M9901 Segmental and somatic dysfunction of cervical region: Secondary | ICD-10-CM | POA: Diagnosis not present

## 2023-12-10 DIAGNOSIS — M545 Low back pain, unspecified: Secondary | ICD-10-CM | POA: Diagnosis not present

## 2023-12-10 DIAGNOSIS — M546 Pain in thoracic spine: Secondary | ICD-10-CM | POA: Diagnosis not present

## 2023-12-10 DIAGNOSIS — M9903 Segmental and somatic dysfunction of lumbar region: Secondary | ICD-10-CM | POA: Diagnosis not present

## 2023-12-10 DIAGNOSIS — M531 Cervicobrachial syndrome: Secondary | ICD-10-CM | POA: Diagnosis not present

## 2023-12-10 DIAGNOSIS — M9902 Segmental and somatic dysfunction of thoracic region: Secondary | ICD-10-CM | POA: Diagnosis not present

## 2024-01-05 DIAGNOSIS — H35371 Puckering of macula, right eye: Secondary | ICD-10-CM | POA: Diagnosis not present

## 2024-01-05 DIAGNOSIS — H43813 Vitreous degeneration, bilateral: Secondary | ICD-10-CM | POA: Diagnosis not present

## 2024-01-05 DIAGNOSIS — H2513 Age-related nuclear cataract, bilateral: Secondary | ICD-10-CM | POA: Diagnosis not present

## 2024-01-05 DIAGNOSIS — Z0100 Encounter for examination of eyes and vision without abnormal findings: Secondary | ICD-10-CM | POA: Diagnosis not present

## 2024-01-05 DIAGNOSIS — Z8669 Personal history of other diseases of the nervous system and sense organs: Secondary | ICD-10-CM | POA: Diagnosis not present

## 2024-01-20 DIAGNOSIS — M9903 Segmental and somatic dysfunction of lumbar region: Secondary | ICD-10-CM | POA: Diagnosis not present

## 2024-01-20 DIAGNOSIS — M545 Low back pain, unspecified: Secondary | ICD-10-CM | POA: Diagnosis not present

## 2024-01-20 DIAGNOSIS — M546 Pain in thoracic spine: Secondary | ICD-10-CM | POA: Diagnosis not present

## 2024-01-20 DIAGNOSIS — M9901 Segmental and somatic dysfunction of cervical region: Secondary | ICD-10-CM | POA: Diagnosis not present

## 2024-01-20 DIAGNOSIS — M531 Cervicobrachial syndrome: Secondary | ICD-10-CM | POA: Diagnosis not present

## 2024-01-20 DIAGNOSIS — M9902 Segmental and somatic dysfunction of thoracic region: Secondary | ICD-10-CM | POA: Diagnosis not present

## 2024-02-24 DIAGNOSIS — M9903 Segmental and somatic dysfunction of lumbar region: Secondary | ICD-10-CM | POA: Diagnosis not present

## 2024-02-24 DIAGNOSIS — M9901 Segmental and somatic dysfunction of cervical region: Secondary | ICD-10-CM | POA: Diagnosis not present

## 2024-02-24 DIAGNOSIS — M9902 Segmental and somatic dysfunction of thoracic region: Secondary | ICD-10-CM | POA: Diagnosis not present

## 2024-02-24 DIAGNOSIS — M531 Cervicobrachial syndrome: Secondary | ICD-10-CM | POA: Diagnosis not present

## 2024-02-24 DIAGNOSIS — M545 Low back pain, unspecified: Secondary | ICD-10-CM | POA: Diagnosis not present

## 2024-02-24 DIAGNOSIS — M546 Pain in thoracic spine: Secondary | ICD-10-CM | POA: Diagnosis not present

## 2024-03-11 DIAGNOSIS — H25043 Posterior subcapsular polar age-related cataract, bilateral: Secondary | ICD-10-CM | POA: Diagnosis not present

## 2024-03-11 DIAGNOSIS — H2513 Age-related nuclear cataract, bilateral: Secondary | ICD-10-CM | POA: Diagnosis not present

## 2024-03-11 DIAGNOSIS — H532 Diplopia: Secondary | ICD-10-CM | POA: Diagnosis not present

## 2024-03-17 DIAGNOSIS — H6123 Impacted cerumen, bilateral: Secondary | ICD-10-CM | POA: Diagnosis not present

## 2024-03-17 DIAGNOSIS — Z1331 Encounter for screening for depression: Secondary | ICD-10-CM | POA: Diagnosis not present

## 2024-03-17 DIAGNOSIS — Z0000 Encounter for general adult medical examination without abnormal findings: Secondary | ICD-10-CM | POA: Diagnosis not present

## 2024-03-17 DIAGNOSIS — J302 Other seasonal allergic rhinitis: Secondary | ICD-10-CM | POA: Diagnosis not present

## 2024-03-17 DIAGNOSIS — E781 Pure hyperglyceridemia: Secondary | ICD-10-CM | POA: Diagnosis not present

## 2024-03-17 DIAGNOSIS — Z1231 Encounter for screening mammogram for malignant neoplasm of breast: Secondary | ICD-10-CM | POA: Diagnosis not present

## 2024-03-17 DIAGNOSIS — Z13820 Encounter for screening for osteoporosis: Secondary | ICD-10-CM | POA: Diagnosis not present

## 2024-03-17 DIAGNOSIS — J4520 Mild intermittent asthma, uncomplicated: Secondary | ICD-10-CM | POA: Diagnosis not present

## 2024-03-17 DIAGNOSIS — I10 Essential (primary) hypertension: Secondary | ICD-10-CM | POA: Diagnosis not present

## 2024-03-17 DIAGNOSIS — R0789 Other chest pain: Secondary | ICD-10-CM | POA: Diagnosis not present

## 2024-03-18 DIAGNOSIS — Z1321 Encounter for screening for nutritional disorder: Secondary | ICD-10-CM | POA: Diagnosis not present

## 2024-03-18 DIAGNOSIS — M8589 Other specified disorders of bone density and structure, multiple sites: Secondary | ICD-10-CM | POA: Diagnosis not present

## 2024-03-18 DIAGNOSIS — E669 Obesity, unspecified: Secondary | ICD-10-CM | POA: Diagnosis not present

## 2024-03-18 DIAGNOSIS — E781 Pure hyperglyceridemia: Secondary | ICD-10-CM | POA: Diagnosis not present

## 2024-03-18 DIAGNOSIS — M818 Other osteoporosis without current pathological fracture: Secondary | ICD-10-CM | POA: Diagnosis not present

## 2024-03-18 DIAGNOSIS — Z131 Encounter for screening for diabetes mellitus: Secondary | ICD-10-CM | POA: Diagnosis not present

## 2024-03-18 DIAGNOSIS — I10 Essential (primary) hypertension: Secondary | ICD-10-CM | POA: Diagnosis not present

## 2024-03-18 DIAGNOSIS — R0789 Other chest pain: Secondary | ICD-10-CM | POA: Diagnosis not present

## 2024-03-18 DIAGNOSIS — R9431 Abnormal electrocardiogram [ECG] [EKG]: Secondary | ICD-10-CM | POA: Diagnosis not present

## 2024-04-28 DIAGNOSIS — M531 Cervicobrachial syndrome: Secondary | ICD-10-CM | POA: Diagnosis not present

## 2024-04-28 DIAGNOSIS — M9903 Segmental and somatic dysfunction of lumbar region: Secondary | ICD-10-CM | POA: Diagnosis not present

## 2024-04-28 DIAGNOSIS — M546 Pain in thoracic spine: Secondary | ICD-10-CM | POA: Diagnosis not present

## 2024-04-28 DIAGNOSIS — M545 Low back pain, unspecified: Secondary | ICD-10-CM | POA: Diagnosis not present

## 2024-04-28 DIAGNOSIS — M9901 Segmental and somatic dysfunction of cervical region: Secondary | ICD-10-CM | POA: Diagnosis not present

## 2024-04-28 DIAGNOSIS — M9902 Segmental and somatic dysfunction of thoracic region: Secondary | ICD-10-CM | POA: Diagnosis not present

## 2024-05-24 DIAGNOSIS — M8588 Other specified disorders of bone density and structure, other site: Secondary | ICD-10-CM | POA: Diagnosis not present

## 2024-06-14 DIAGNOSIS — M9903 Segmental and somatic dysfunction of lumbar region: Secondary | ICD-10-CM | POA: Diagnosis not present

## 2024-06-14 DIAGNOSIS — M9901 Segmental and somatic dysfunction of cervical region: Secondary | ICD-10-CM | POA: Diagnosis not present

## 2024-06-14 DIAGNOSIS — M9902 Segmental and somatic dysfunction of thoracic region: Secondary | ICD-10-CM | POA: Diagnosis not present

## 2024-06-14 DIAGNOSIS — M545 Low back pain, unspecified: Secondary | ICD-10-CM | POA: Diagnosis not present

## 2024-06-14 DIAGNOSIS — M531 Cervicobrachial syndrome: Secondary | ICD-10-CM | POA: Diagnosis not present

## 2024-06-14 DIAGNOSIS — M546 Pain in thoracic spine: Secondary | ICD-10-CM | POA: Diagnosis not present

## 2024-07-26 DIAGNOSIS — M546 Pain in thoracic spine: Secondary | ICD-10-CM | POA: Diagnosis not present

## 2024-07-26 DIAGNOSIS — M9902 Segmental and somatic dysfunction of thoracic region: Secondary | ICD-10-CM | POA: Diagnosis not present

## 2024-07-26 DIAGNOSIS — M531 Cervicobrachial syndrome: Secondary | ICD-10-CM | POA: Diagnosis not present

## 2024-07-26 DIAGNOSIS — M9903 Segmental and somatic dysfunction of lumbar region: Secondary | ICD-10-CM | POA: Diagnosis not present

## 2024-07-26 DIAGNOSIS — M9901 Segmental and somatic dysfunction of cervical region: Secondary | ICD-10-CM | POA: Diagnosis not present

## 2024-07-26 DIAGNOSIS — M545 Low back pain, unspecified: Secondary | ICD-10-CM | POA: Diagnosis not present

## 2024-08-26 ENCOUNTER — Other Ambulatory Visit: Payer: Self-pay | Admitting: Orthopedic Surgery

## 2024-08-26 DIAGNOSIS — M2392 Unspecified internal derangement of left knee: Secondary | ICD-10-CM

## 2024-08-26 DIAGNOSIS — M2352 Chronic instability of knee, left knee: Secondary | ICD-10-CM

## 2024-09-02 ENCOUNTER — Ambulatory Visit
Admission: RE | Admit: 2024-09-02 | Discharge: 2024-09-02 | Disposition: A | Discharge status: Home / Self Care | Source: Ambulatory Visit | Attending: Orthopedic Surgery | Admitting: Orthopedic Surgery

## 2024-09-02 DIAGNOSIS — M2352 Chronic instability of knee, left knee: Secondary | ICD-10-CM

## 2024-09-02 DIAGNOSIS — M2392 Unspecified internal derangement of left knee: Secondary | ICD-10-CM

## 2024-09-06 DIAGNOSIS — T1590XA Foreign body on external eye, part unspecified, unspecified eye, initial encounter: Secondary | ICD-10-CM

## 2024-09-06 DIAGNOSIS — M2352 Chronic instability of knee, left knee: Secondary | ICD-10-CM

## 2024-09-06 DIAGNOSIS — M2392 Unspecified internal derangement of left knee: Secondary | ICD-10-CM

## 2024-09-08 ENCOUNTER — Ambulatory Visit
Admission: RE | Admit: 2024-09-08 | Discharge: 2024-09-08 | Disposition: A | Discharge status: Home / Self Care | Source: Ambulatory Visit | Attending: Orthopedic Surgery | Admitting: Orthopedic Surgery

## 2024-09-08 DIAGNOSIS — M2392 Unspecified internal derangement of left knee: Secondary | ICD-10-CM

## 2024-09-08 DIAGNOSIS — T1590XA Foreign body on external eye, part unspecified, unspecified eye, initial encounter: Secondary | ICD-10-CM

## 2024-09-08 DIAGNOSIS — M2352 Chronic instability of knee, left knee: Secondary | ICD-10-CM

## 2024-09-14 ENCOUNTER — Inpatient Hospital Stay: Admission: RE | Admit: 2024-09-14 | Source: Ambulatory Visit

## 2024-09-14 ENCOUNTER — Other Ambulatory Visit

## 2024-09-19 ENCOUNTER — Other Ambulatory Visit: Payer: Self-pay | Admitting: Obstetrics and Gynecology

## 2024-09-19 DIAGNOSIS — Z1231 Encounter for screening mammogram for malignant neoplasm of breast: Secondary | ICD-10-CM

## 2024-10-13 ENCOUNTER — Ambulatory Visit
